# Patient Record
Sex: Female | Born: 1935
Health system: Southern US, Community
[De-identification: ages and names within clinical notes are randomized; demographics above are authoritative.]

## PROBLEM LIST (undated history)

## (undated) DIAGNOSIS — M109 Gout, unspecified: Secondary | ICD-10-CM

## (undated) DIAGNOSIS — N390 Urinary tract infection, site not specified: Secondary | ICD-10-CM

## (undated) DIAGNOSIS — E876 Hypokalemia: Secondary | ICD-10-CM

## (undated) DIAGNOSIS — J189 Pneumonia, unspecified organism: Secondary | ICD-10-CM

## (undated) DIAGNOSIS — E785 Hyperlipidemia, unspecified: Secondary | ICD-10-CM

## (undated) DIAGNOSIS — E0590 Thyrotoxicosis, unspecified without thyrotoxic crisis or storm: Secondary | ICD-10-CM

## (undated) DIAGNOSIS — I10 Essential (primary) hypertension: Secondary | ICD-10-CM

## (undated) DIAGNOSIS — I509 Heart failure, unspecified: Secondary | ICD-10-CM

## (undated) DIAGNOSIS — L03119 Cellulitis of unspecified part of limb: Secondary | ICD-10-CM

## (undated) HISTORY — DX: Hyperlipidemia, unspecified: E78.5

## (undated) HISTORY — PX: TONSILLECTOMY AND ADENOIDECTOMY: SUR1326

## (undated) HISTORY — DX: Pneumonia, unspecified organism: J18.9

## (undated) HISTORY — DX: Gout, unspecified: M10.9

## (undated) HISTORY — DX: Hypokalemia: E87.6

## (undated) HISTORY — DX: Cellulitis of unspecified part of limb: L03.119

## (undated) HISTORY — DX: Heart failure, unspecified: I50.9

## (undated) HISTORY — DX: Urinary tract infection, site not specified: N39.0

## (undated) HISTORY — DX: Essential (primary) hypertension: I10

## (undated) HISTORY — DX: Thyrotoxicosis, unspecified without thyrotoxic crisis or storm: E05.90

---

## 1981-09-21 HISTORY — PX: BREAST LUMPECTOMY: SHX2

## 2011-09-22 HISTORY — PX: CARDIAC CATHETERIZATION: SHX172

## 2012-09-21 HISTORY — PX: PACEMAKER INSERTION: SHX728

## 2012-09-21 HISTORY — PX: VALVE REPLACEMENT: SUR13

## 2015-01-09 ENCOUNTER — Ambulatory Visit (INDEPENDENT_AMBULATORY_CARE_PROVIDER_SITE_OTHER): Payer: Commercial Managed Care - HMO | Admitting: Internal Medicine

## 2015-01-09 ENCOUNTER — Ambulatory Visit (INDEPENDENT_AMBULATORY_CARE_PROVIDER_SITE_OTHER)
Admission: RE | Admit: 2015-01-09 | Discharge: 2015-01-09 | Disposition: A | Discharge status: Home / Self Care | Payer: Commercial Managed Care - HMO | Source: Ambulatory Visit | Attending: Internal Medicine | Admitting: Internal Medicine

## 2015-01-09 ENCOUNTER — Encounter: Payer: Self-pay | Admitting: Internal Medicine

## 2015-01-09 VITALS — BP 130/66 | HR 91 | Ht 62.0 in | Wt 140.0 lb

## 2015-01-09 DIAGNOSIS — J9612 Chronic respiratory failure with hypercapnia: Secondary | ICD-10-CM

## 2015-01-09 DIAGNOSIS — J449 Chronic obstructive pulmonary disease, unspecified: Secondary | ICD-10-CM | POA: Insufficient documentation

## 2015-01-09 DIAGNOSIS — R918 Other nonspecific abnormal finding of lung field: Secondary | ICD-10-CM | POA: Diagnosis not present

## 2015-01-09 MED ORDER — UMECLIDINIUM-VILANTEROL 62.5-25 MCG/INH IN AEPB: 2.0000 | INHALATION_SPRAY | Freq: Once | RESPIRATORY_TRACT | Status: DC

## 2015-01-09 NOTE — Patient Instructions (Signed)
Stop advair   Plan A automatic  Start anoro one click each x 2 drags  Plan B = backup Only use your albuterol as a rescue medication to be used if you can't catch your breath by resting or doing a relaxed purse lip breathing pattern.  - The less you use it, the better it will work when you need it. - Ok to use up to 2 puffs  every 4 hours if you must but call for immediate appointment if use goes up over your usual need - Don't leave home without it !!  (think of it like the spare tire for your car)    Plan C = crisis Nebulizer = duoneb ok to use up every 6 hours   For now stay on 02  Work on inhaler technique:  relax and gently blow all the way out then take a nice smooth deep breath back in, triggering the inhaler at same time you start breathing in.  Hold for up to 5 seconds if you can.  Rinse and gargle with water when done     Please remember to go to the  x-ray department downstairs for your tests - we will call you with the results when they are available.     Please schedule a follow up office visit in 2 weeks, sooner if needed

## 2015-01-09 NOTE — Progress Notes (Signed)
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     Admit date: 12/15/2014 Discharge date   12/19/2014    Admitting Physician: Donald Prose, MD Discharge Physician: Donald Prose, MD   Discharge Diagnoses:  Acute hypoxic respiratory failure due to bacterial CAP and sepsis E coli UTI  Admission Condition: fair Discharged Condition: good  Hospital Course:  For full details, please see H&P, progress notes, consult notes and ancillary notes. Briefly, Victoria Knox is a 80 y.o. female Caucasian with severe AS s/p TAVR, CHF, COPD who presented with fevers, nausea, vomiting found to have PNA and cystitis.  Acute hypoxic respiratory failure due to bacterial CAP: In the ED, the patient was febrile with a CXR showing worsening of right basilar opacities concerning for pneumonia. She was started on rocephin and azithro for empiric coverage of CAP microbes. The patient had an episode of hypoxia and increased lethargy on the floor on 3/28 that improved with narcan, concerning for likely overmedication. She was noted to desaturate with ambulaton, and required 4L via [place] to improve her oxygenation. Prior to discharge, she completed a 5-day course of antibiotics with azithro and rocephin.   E coli UTI: The patient was noted to have nitrites and bacteria on UA with a urine culture that showed e coli resistant only to cipro, bactrim, and augmentin.  She completed a 5-day course of antibiotics prior to discharge as noted above.  Discharge Follow-up Action Items: 1. Follow-up in 1-2 weeks for hospital follow-up 2. Monitor for respiratory improvement and ability to wean home oxygen  Patient's Ordered Code Status: DNR/DNI Limited scope of treatment     01/09/2015 1st [facility] [facility] office visit/ Victoria Knox   Chief Complaint  Patient presents with  . Pulmonary Consult    Referred by Lucita Lora, PA at [facility] in [place]. Pt states dxed with COPD in 2014. She c/o DOE for the past 3 wks- gets out of breath walking approx 75 ft. She had been "walking miles" every  day while at work with no problem.   Last echo ok 10/02/14 with no pfts on file in care everywhere.   No obvious day to day or daytime variabilty or assoc chronic cough or cp or chest tightness, subjective wheeze overt sinus or hb symptoms. No unusual exp hx or h/o childhood pna/ asthma or knowledge of premature birth.  Sleeping ok without nocturnal  or early am exacerbation  of respiratory  c/o's or need for noct saba. Also denies any obvious fluctuation of symptoms with weather or environmental changes or other aggravating or alleviating factors except as outlined above   Current Medications, Allergies, Complete Past Medical History, Past Surgical History, Family History, and Social History were reviewed in [employer] record.  Review of Systems  Constitutional: Negative for fever, chills and unexpected weight change.  HENT: Negative for congestion, dental problem, ear pain, nosebleeds, postnasal drip, rhinorrhea, sinus pressure, sneezing, sore throat, trouble swallowing and voice change.   Eyes: Negative for visual disturbance.  Respiratory: Positive for shortness of breath. Negative for cough and choking.   Cardiovascular: Negative for chest pain and leg swelling.  Gastrointestinal: Negative for vomiting, abdominal pain and  diarrhea.  Genitourinary: Negative for difficulty urinating.  Musculoskeletal: Negative for arthralgias.  Skin: Negative for rash.  Neurological: Negative for tremors, syncope and headaches.  Hematological: Does not bruise/bleed easily.       Objective:   Physical Exam   W/c bound wf talking at an extreme rate of speed s difficulty   Wt Readings from Last 3 Encounters:  01/09/15 140 lb (63.504 kg)    Vital signs reviewed  HEENT: nl dentition, turbinates, and orophanx. Nl external ear canals without cough reflex   NECK :  without JVD/Nodes/TM/ nl carotid upstrokes bilaterally   LUNGS: no acc muscle use, clear to A and P bilaterally without cough on insp or exp maneuvers   CV:  RRR  no s3 or murmur or increase in P2, no edema   ABD:  soft and nontender with nl excursion in the supine position. No bruits or organomegaly, bowel sounds nl  MS:  warm without deformities, calf tenderness, cyanosis or clubbing  SKIN: warm and dry without lesions    NEURO:  alert, approp, no deficits    CXR PA and Lateral:   01/09/2015 :     I personally reviewed images and agree with radiology impression as follows:     Asymmetric pattern of mixed interstitial and airspace disease, predominantly involving the right base. Differential diagnosis includes multifocal pneumonia, asymmetric edema, with less likely etiologies including lymphangitic carcinomatosis, chronic interstitial lung disease    Assessment & Plan:

## 2015-01-10 ENCOUNTER — Telehealth: Payer: Self-pay | Admitting: Internal Medicine

## 2015-01-10 ENCOUNTER — Encounter: Payer: Self-pay | Admitting: Internal Medicine

## 2015-01-10 DIAGNOSIS — R918 Other nonspecific abnormal finding of lung field: Secondary | ICD-10-CM | POA: Insufficient documentation

## 2015-01-10 DIAGNOSIS — J9612 Chronic respiratory failure with hypercapnia: Secondary | ICD-10-CM | POA: Insufficient documentation

## 2015-01-10 NOTE — Telephone Encounter (Signed)
Patient says that the Spiriva caused her to have a severe asthma attack.  Patient wants to make sure that the Anoro will not cause the same problem.  She said that she has taken Advair before and didn't have a problem with that.  She is afraid to try the Anoro and wants reassurance from Dr. Melvyn Novas that the Jearl Klinefelter is nothing like Spiriva and she will not have the same reaction.   MW - please advise.

## 2015-01-10 NOTE — Progress Notes (Signed)
Quick Note:  ATC, line was busy, WCB ______

## 2015-01-10 NOTE — Assessment & Plan Note (Signed)
Acute changes noted 12/15/14 in setting of uti ? Sepsis ? ali > denies exposure to [facility] but this is also in ddx   Will need f/u in 2 weeks with cxr/ bnp/ esr but limited options for w/u with ddx  Miscellaneous:Alv microlithiasis, alv proteinosis, asp, bronchiectais, BOOP   ARDS/ AIP Occupational dz/ HSP Neoplasm Infection Drug (esp macrodantin in pt with recent utis) Pulmonary emboli, Protein disorders Edema/Eosinophilic dz Sarcoidosis Hist X / Hemorrhage Idiopathic /collagen vasc dz related

## 2015-01-10 NOTE — Telephone Encounter (Signed)
Patient notified.  Patient will try the Anoro.  If it doesn't work, she will call and let us know.  We can send in Advair 250/50 if the Anoro does not work. Nothing further needed.

## 2015-01-10 NOTE — Assessment & Plan Note (Addendum)
   DDX of  difficult airways management all start with A and  include Adherence, Ace Inhibitors, Acid Reflux, Active Sinus Disease, Alpha 1 Antitripsin deficiency, Anxiety masquerading as Airways dz,  ABPA,  allergy(esp in young), Aspiration (esp in elderly), Adverse effects of DPI,  Active smokers, plus two Bs  = Bronchiectasis and Beta blocker use..and one C= CHF  Adherence is always the initial "prime suspect" and is a multilayered concern that requires a "trust but verify" approach in every patient - starting with knowing how to use medications, especially inhalers, correctly, keeping up with refills and understanding the fundamental difference between maintenance and prns vs those medications only taken for a very short course and then stopped and not refilled.  - The proper method of use, as well as anticipated side effects, of a metered-dose inhaler are discussed and demonstrated to the patient. Improved effectiveness after extensive coaching during this visit to a level of approximately  75% but 90% with dpi   ? Asp/gerd > rec leave off fish oil for now  ? Chf/ not supported by last echo at [facility] 09/2014   Needs f/u for pfts

## 2015-01-10 NOTE — Telephone Encounter (Signed)
We did the anoro in the office and she did fine and it's not the same medication as spiriva so should do fine.  If she wants to take advair instead that's also fine but the dose is 250/50 one bid and call it in if she doesn't have it

## 2015-01-10 NOTE — Telephone Encounter (Signed)
ATC-Busy signal  wcb

## 2015-01-10 NOTE — Assessment & Plan Note (Signed)
-   HC03  41 11/22/14 but goes back to 2014 in chemistries at [facility]   So this is longstanding and has been relatively well compensated until now albeit with way too much exp to chronic saba and should try a LAMA/LABA combination having failed to tol spiriva in past > anoro started (see copd)  In meantime rec wear 02 24/7

## 2015-01-14 NOTE — Progress Notes (Signed)
Quick Note:  Spoke with pt and notified of results per Dr. Wert. Pt verbalized understanding and denied any questions.  ______ 

## 2015-01-17 ENCOUNTER — Telehealth: Payer: Self-pay | Admitting: Internal Medicine

## 2015-01-17 NOTE — Telephone Encounter (Signed)
Ok to stop but will need ov with me or Tammy to work out alternative in meantime has albuterol hfa and neb

## 2015-01-17 NOTE — Telephone Encounter (Signed)
Called and spoke to pt. Pt c/o hand tremors/feeling very jittery after taking anoro for 6 days. Pt stated she does not want to take it anymore. Pt stated the anoro helped her breathing when she takes it (at 0800) and will last till about 5pm and then her breathing feels worse.   MW please advise.

## 2015-01-17 NOTE — Telephone Encounter (Signed)
Called and spoke to pt. Informed her of the recs per MW. Pt already has a pending appt on 5/4. Pt verbalized understanding and denied any further questions or concerns at this time.

## 2015-01-23 ENCOUNTER — Other Ambulatory Visit (INDEPENDENT_AMBULATORY_CARE_PROVIDER_SITE_OTHER): Payer: Commercial Managed Care - HMO

## 2015-01-23 ENCOUNTER — Ambulatory Visit (INDEPENDENT_AMBULATORY_CARE_PROVIDER_SITE_OTHER): Payer: Commercial Managed Care - HMO | Admitting: Internal Medicine

## 2015-01-23 ENCOUNTER — Encounter: Payer: Self-pay | Admitting: Internal Medicine

## 2015-01-23 ENCOUNTER — Ambulatory Visit (INDEPENDENT_AMBULATORY_CARE_PROVIDER_SITE_OTHER)
Admission: RE | Admit: 2015-01-23 | Discharge: 2015-01-23 | Disposition: A | Discharge status: Home / Self Care | Payer: Commercial Managed Care - HMO | Source: Ambulatory Visit | Attending: Internal Medicine | Admitting: Internal Medicine

## 2015-01-23 VITALS — BP 124/72 | HR 93 | Ht 62.0 in | Wt 139.0 lb

## 2015-01-23 DIAGNOSIS — J9612 Chronic respiratory failure with hypercapnia: Secondary | ICD-10-CM

## 2015-01-23 DIAGNOSIS — R0600 Dyspnea, unspecified: Secondary | ICD-10-CM

## 2015-01-23 DIAGNOSIS — J449 Chronic obstructive pulmonary disease, unspecified: Secondary | ICD-10-CM | POA: Diagnosis not present

## 2015-01-23 DIAGNOSIS — E0590 Thyrotoxicosis, unspecified without thyrotoxic crisis or storm: Secondary | ICD-10-CM | POA: Diagnosis not present

## 2015-01-23 LAB — CBC WITH DIFFERENTIAL/PLATELET
Basophils Absolute: 0 10*3/uL (ref 0.0–0.1)
Basophils Relative: 0.2 % (ref 0.0–3.0)
EOS PCT: 10.5 % — AB (ref 0.0–5.0)
Eosinophils Absolute: 0.7 10*3/uL (ref 0.0–0.7)
HCT: 41 % (ref 36.0–46.0)
Hemoglobin: 13.6 g/dL (ref 12.0–15.0)
Lymphocytes Relative: 28.2 % (ref 12.0–46.0)
Lymphs Abs: 1.9 10*3/uL (ref 0.7–4.0)
MCHC: 33.3 g/dL (ref 30.0–36.0)
MCV: 90.9 fl (ref 78.0–100.0)
MONOS PCT: 8 % (ref 3.0–12.0)
Monocytes Absolute: 0.5 10*3/uL (ref 0.1–1.0)
NEUTROS ABS: 3.5 10*3/uL (ref 1.4–7.7)
Neutrophils Relative %: 53.1 % (ref 43.0–77.0)
Platelets: 202 10*3/uL (ref 150.0–400.0)
RBC: 4.51 Mil/uL (ref 3.87–5.11)
RDW: 17 % — AB (ref 11.5–15.5)
WBC: 6.7 10*3/uL (ref 4.0–10.5)

## 2015-01-23 LAB — BASIC METABOLIC PANEL
BUN: 15 mg/dL (ref 6–23)
CALCIUM: 9.6 mg/dL (ref 8.4–10.5)
CO2: 35 mEq/L — ABNORMAL HIGH (ref 19–32)
Chloride: 99 mEq/L (ref 96–112)
Creatinine, Ser: 0.74 mg/dL (ref 0.40–1.20)
GFR: 80.63 mL/min (ref >60.00)
Glucose, Bld: 110 mg/dL — ABNORMAL HIGH (ref 70–99)
Potassium: 3.5 mEq/L (ref 3.5–5.1)
Sodium: 140 mEq/L (ref 135–145)

## 2015-01-23 LAB — TSH: TSH: 0.06 u[IU]/mL — ABNORMAL LOW (ref 0.35–4.50)

## 2015-01-23 LAB — BRAIN NATRIURETIC PEPTIDE: PRO B NATRI PEPTIDE: 206 pg/mL — AB (ref 0.0–100.0)

## 2015-01-23 LAB — SEDIMENTATION RATE: SED RATE: 35 mm/h — AB (ref 0–22)

## 2015-01-23 NOTE — Progress Notes (Signed)
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    History of Present Illness  Admit date: 12/15/2014 Discharge date   12/19/2014    Discharge Diagnoses:  Acute hypoxic respiratory failure due to bacterial CAP and sepsis E coli UTI  Admission Condition: fair Discharged Condition: good  Hospital Course:  For full details, please see H&P, progress notes, consult notes and ancillary notes. Briefly, Victoria Knox is a 80 y.o. female Caucasian with severe AS s/p TAVR 2014 , CHF, COPD who presented with fevers, nausea, vomiting found to have PNA and cystitis.  Acute hypoxic respiratory failure due to bacterial CAP: In the ED, the patient was febrile with a CXR showing worsening of right basilar opacities concerning for pneumonia. She was started on rocephin and azithro for empiric coverage of CAP microbes. The patient had an episode of hypoxia and increased lethargy on the floor on 3/28 that improved with narcan, concerning for likely overmedication. She was noted to desaturate with ambulaton, and required 4L via [place] to improve her oxygenation. Prior to discharge, she completed a 5-day course of antibiotics with azithro and rocephin.   E coli UTI: The patient was noted to have nitrites and bacteria on UA with a urine culture that showed e coli resistant only to cipro, bactrim, and augmentin. She completed a  5-day course of antibiotics prior to discharge as noted above.     Patient's Ordered Code Status: DNR/DNI Limited scope of treatment     01/09/2015 1st [place] [facility] office visit/ Brittley Regner   Chief Complaint  Patient presents with  . Pulmonary Consult    Referred by Lucita Lora, PA at [facility] in [place]. Pt states dxed with COPD in 2014. She c/o DOE for the past 3 wks- gets out of breath walking approx 75 ft. She had been "walking miles" every  day while at work with no problem.   Last echo ok 10/02/14 with no pfts on file in care everywhere.  rec Stop advair  Plan A automatic  Start anoro one click each x 2 drags> could not tolerate jitteriness  Plan B = backup Only use your albuterol as a rescue medication  Plan C = crisis Nebulizer = duoneb ok to use up every 6 hours  For now stay on 02 Work on inhaler technique:             01/23/2015 f/u ov/[facility] re: new onset resp failure/ hypercarbia with GOLD IV copd  Chief Complaint  Patient presents with  . Follow-up  wanted to change the original hx as follows:  Doe went from  bad to worse starting in Dec 2015 admit with pna at [place]  got better enough to go back to work walking "all over the place s  people passing her" on just on albterol and no 02 then kidney infection> admit > then placed on 02   Breathing not much better on anoro, best resp is to neb saba last used 3 h prior to OV   No cough at all "but I've had bronchitis all my life"    No obvious day to day or daytime variabilty or assoc chronic cough or cp or chest tightness, subjective wheeze overt sinus or hb symptoms. No unusual exp hx or h/o childhood pna/ asthma or knowledge of premature birth.  Sleeping ok without nocturnal  or early am exacerbation  of respiratory  c/o's or need for noct saba. Also denies any obvious fluctuation of symptoms with weather or environmental changes or other aggravating or alleviating factors except as outlined above    Current Medications, Allergies, Complete Past Medical History, Past Surgical History, Family History, and Social History were reviewed in [employer] record.  ROS  The following are not active complaints unless bolded sore throat, dysphagia, dental problems, itching, sneezing,  nasal congestion or excess/ purulent secretions, ear ache,   fever, chills, sweats, unintended wt loss, pleuritic or exertional cp, hemoptysis,  orthopnea pnd or leg swelling, presyncope, palpitations, heartburn, abdominal pain, anorexia, nausea, vomiting, diarrhea  or change in bowel or urinary habits, change in stools or urine, dysuria,hematuria,  rash, arthralgias, visual complaints, headache, numbness weakness or ataxia or problems with walking or coordination,  change in mood/affect or memory.                          Objective:   Physical Exam   W/c bound wf talking at an extreme rate of speed s difficulty   Wt Readings from Last 3 Encounters:  01/23/15 139 lb (63.05 kg)  01/09/15 140 lb (63.504 kg)    Vital signs reviewed   HEENT: nl dentition, turbinates, and orophanx. Nl external ear canals without cough reflex   NECK :  without JVD/Nodes/TM/ nl carotid upstrokes bilaterally   LUNGS: no acc muscle use, clear to A and P bilaterally without cough on insp or exp maneuvers   CV:  RRR  no s3 or murmur or increase in P2, no edema   ABD:  soft and nontender with nl excursion in the supine position. No bruits or organomegaly, bowel sounds nl  MS:  warm without deformities, calf tenderness, cyanosis or clubbing  SKIN: warm and dry without lesions    NEURO:  alert, approp, no deficits     CXR PA and Lateral:   01/23/2015 :     I personally reviewed images and agree with radiology impression as follows:     Stable cardiomegaly, COPD, and bibasilar scarring. No acute Findings   Labs ordered/ reviewed:   Lab 01/23/15 1512  NA 140  K 3.5  CL 99  CO2 35*  BUN 15   CREATININE 0.74  GLUCOSE 110*      Lab 01/23/15 1512  HGB 13.6  HCT 41.0  WBC 6.7  PLT 202.0     Lab Results  Component Value Date   TSH 0.06* 01/23/2015     Lab Results  Component Value Date   PROBNP 206.0* 01/23/2015     Lab Results  Component Value Date   ESRSEDRATE 35* 01/23/2015      .m     Assessment & Plan:

## 2015-01-23 NOTE — Patient Instructions (Addendum)
Ok to leave 02 off at rest and walking around your house but always wear 02 2lpm at bedtime and  walking outside when you walk at a normal pace and if you monitor your 02 the goal is to keep above 90% (so walking slowly off 02 might be a strategy you can use if you don't want to wear 02)  Please remember to go to the lab and x-ray department downstairs for your tests - we will call you with the results when they are available.     GERD (REFLUX)  is an extremely common cause of respiratory symptoms just like yours , many times with no obvious heartburn at all.    It can be treated with medication, but also with lifestyle changes including avoidance of late meals, excessive alcohol, smoking cessation, and avoid fatty foods, chocolate, peppermint, colas, red wine, and acidic juices such as orange juice.  NO MINT OR MENTHOL PRODUCTS SO NO COUGH DROPS  USE SUGARLESS CANDY INSTEAD (Jolley ranchers or Stover's or Life Savers) or even ice chips will also do - the key is to swallow to prevent all throat clearing. NO OIL BASED VITAMINS - use powdered substitutes.- no fish oil - eat more salmon  Please schedule a follow up office visit in 6 weeks, call sooner if needed with pfts on return   Late add : referred to endocrine for low tsh

## 2015-01-24 ENCOUNTER — Telehealth: Payer: Self-pay | Admitting: Internal Medicine

## 2015-01-24 ENCOUNTER — Encounter: Payer: Self-pay | Admitting: Internal Medicine

## 2015-01-24 ENCOUNTER — Other Ambulatory Visit: Payer: Self-pay | Admitting: Internal Medicine

## 2015-01-24 DIAGNOSIS — E0590 Thyrotoxicosis, unspecified without thyrotoxic crisis or storm: Secondary | ICD-10-CM

## 2015-01-24 DIAGNOSIS — J449 Chronic obstructive pulmonary disease, unspecified: Secondary | ICD-10-CM

## 2015-01-24 NOTE — Telephone Encounter (Signed)
MW are you okay with this order? thanks

## 2015-01-24 NOTE — Assessment & Plan Note (Signed)
-   echo 10/02/14 left ventricular size is normal. Left ventricular systolic function is normal. LV ejection fraction = 55%. Abnormal (paradoxical) septal motion consistent with RV pacemaker. Left ventricular filling pattern is impaired. The right ventricle is normal in size and function. The left atrium is mildly dilated. 3 year follow up for 28mm Core Valve No aortic regurgitation is present. There is mild mitral regurgitation.   Lab Results  Component Value Date   PROBNP 206.0* 01/23/2015     Doubt sign chf at present > no change needed

## 2015-01-24 NOTE — Assessment & Plan Note (Signed)
TSH 01/24/2015  = 0.06 assoc with rapid speech   Will need endocrinology eval > referred

## 2015-01-24 NOTE — Assessment & Plan Note (Signed)
-   HC03  39 11/22/14 but goes back to 2014 in chemistries at [facility]  - 01/23/2015   Walked RA x one lap @ 185 stopped due to desat  To 82% without sob, then did one lap on 2lpm s desat but stopped by hip pain    rx as of 01/23/15 = 02 2lpm sleeping and walking outside the house but otherwise no need for 02

## 2015-01-24 NOTE — Assessment & Plan Note (Signed)
-   quit smoking 2006  - 01/09/2015 p extensive coaching HFA effectiveness =    25%  - trial of anoro one puff each am 01/09/2015 > could not tol due to jittery - 01/23/2015 spirometry FEV1  0.48 ( 27%) ratio 41   I had an extended discussion with the patient reviewing all relevant studies completed to date and  lasting 15 to 20 minutes of a 25 minute visit on the following ongoing concerns:   She is probably much worse than previously apparent by her hx as I strongly doubt she was "walking all over the place" after her admit with pna in dec 2015 and ? Whether also had  Sepsis/ ali leading to 02 dep since her admit to [facility] but for now little to offer except for neb as can't tol lama or laba and doesn't appear to have enough AB to warrant ICS  02 issues adderessed separately   Will ask her to return for full pfts and regroup then

## 2015-01-24 NOTE — Progress Notes (Signed)
Quick Note:  Spoke with pt and notified of results per Dr. Wert. Pt verbalized understanding and denied any questions.  ______ 

## 2015-01-24 NOTE — Telephone Encounter (Signed)
Fine with me

## 2015-01-24 NOTE — Telephone Encounter (Signed)
ATC x 3. Line busy. WCB.  

## 2015-01-25 NOTE — Telephone Encounter (Signed)
Called and spoke to pt. Informed pt of the recs per MW. Pulmonary rehab referral order placed. Pt verbalized understanding and denied any further questions or concerns at this time.

## 2015-01-30 ENCOUNTER — Other Ambulatory Visit: Payer: Self-pay | Admitting: Internal Medicine

## 2015-01-30 NOTE — Telephone Encounter (Signed)
Called [facility] and spoke with Collie Siad at [phone] asking about an appt for pt. Collie Siad states she hadn't received fax and asked that we refax it. Suanne Marker is refaxing papers per request. Tried to call patient to inform that paperwork was signed today by Dr.Wert and that it has been faxed. Also, to let pt know that once it is received that she would be contacted by the [facility].

## 2015-01-31 NOTE — Telephone Encounter (Signed)
Patient notified.  Patient given number for [facility] to follow up with their office. Nothing further needed.

## 2015-02-08 ENCOUNTER — Encounter: Payer: Self-pay | Admitting: Endocrinology

## 2015-02-08 ENCOUNTER — Ambulatory Visit (INDEPENDENT_AMBULATORY_CARE_PROVIDER_SITE_OTHER): Payer: Commercial Managed Care - HMO | Admitting: Endocrinology

## 2015-02-08 VITALS — BP 126/64 | HR 96 | Temp 98.1°F | Wt 142.0 lb

## 2015-02-08 DIAGNOSIS — E0590 Thyrotoxicosis, unspecified without thyrotoxic crisis or storm: Secondary | ICD-10-CM

## 2015-02-08 MED ORDER — METHIMAZOLE 10 MG PO TABS: 10.0000 mg | ORAL_TABLET | Freq: Two times a day (BID) | ORAL | Status: DC

## 2015-02-08 NOTE — Patient Instructions (Signed)
i have sent a prescription to your pharmacy, for the thyroid if ever you have fever while taking methimazole, stop it and call us, because of the risk of a rare side-effect Please come back for a follow-up appointment in 2-3 weeks We can reconsider the radioactive iodine treatment in the future if you want to.   Hyperthyroidism The thyroid is a large gland located in the lower front part of your neck. The thyroid helps control metabolism. Metabolism is how your body uses food. It controls metabolism with the hormone thyroxine. When the thyroid is overactive, it produces too much hormone. When this happens, these following problems may occur:   Nervousness  Heat intolerance  Weight loss (in spite of increase food intake)  Diarrhea  Change in hair or skin texture  Palpitations (heart skipping or having extra beats)  Tachycardia (rapid heart rate)  Loss of menstruation (amenorrhea)  Shaking of the hands CAUSES  Grave's Disease (the immune system attacks the thyroid gland). This is the most common cause.  Inflammation of the thyroid gland.  Tumor (usually benign) in the thyroid gland or elsewhere.  Excessive use of thyroid medications (both prescription and 'natural').  Excessive ingestion of Iodine. DIAGNOSIS  To prove hyperthyroidism, your caregiver may do blood tests and ultrasound tests. Sometimes the signs are hidden. It may be necessary for your caregiver to watch this illness with blood tests, either before or after diagnosis and treatment. TREATMENT Short-term treatment There are several treatments to control symptoms. Drugs called beta blockers may give some relief. Drugs that decrease hormone production will provide temporary relief in many people. These measures will usually not give permanent relief. Definitive therapy There are treatments available which can be discussed between you and your caregiver which will permanently treat the problem. These treatments range  from surgery (removal of the thyroid), to the use of radioactive iodine (destroys the thyroid by radiation), to the use of antithyroid drugs (interfere with hormone synthesis). The first two treatments are permanent and usually successful. They most often require hormone replacement therapy for life. This is because it is impossible to remove or destroy the exact amount of thyroid required to make a person euthyroid (normal). HOME CARE INSTRUCTIONS  See your caregiver if the problems you are being treated for get worse. Examples of this would be the problems listed above. SEEK MEDICAL CARE IF: Your general condition worsens. MAKE SURE YOU:   Understand these instructions.  Will watch your condition.  Will get help right away if you are not doing well or get worse. Document Released: 09/07/2005 Document Revised: 11/30/2011 Document Reviewed: 01/19/2007 [facility] Patient Information 2015 [place], [place]. This information is not intended to replace advice given to you by your health care provider. Make sure you discuss any questions you have with your health care provider.

## 2015-02-08 NOTE — Progress Notes (Signed)
Subjective:    Patient ID: Victoria Knox, female    DOB: 11/23/1934, 80 y.o.   MRN: [id number]  HPI Pt says she was dx'ed with hyperthyroidism in 2008.  she has never been on therapy for this.  He has never had XRT to the anterior neck, or thyroid surgery, but she has had [place] and Bx several times over these years.  she does not consume kelp or any other prescribed or non-prescribed thyroid medication.  He has never been on amiodarone.   She reports slight dyspnea sensation in the chest, and assoc fatigue.   Past Medical History  Diagnosis Date  . Cellulitis of lower limb   . CHF (congestive heart failure)   . Pneumonia   . Gout   . UTI (lower urinary tract infection)   . Hyperlipidemia   . Benign essential HTN   . Hypopotassemia   . Hyperthyroidism     Past Surgical History  Procedure Laterality Date  . Breast lumpectomy Left 1983  . Tonsillectomy and adenoidectomy    . Cardiac catheterization  2013  . Pacemaker insertion  Jan 2014  . Valve replacement  Jan 2014    History   Social History  . Marital Status: Widowed    Spouse Name: N/A  . Number of Children: N/A  . Years of Education: N/A   Occupational History  . [employer]    Social History Main Topics  . Smoking status: Former Smoker -- 1.00 packs/day for 40 years    Types: Cigarettes    Quit date: 09/21/2004  . Smokeless tobacco: Not on file  . Alcohol Use: Not on file  . Drug Use: Not on file  . Sexual Activity: Not on file   Other Topics Concern  . Not on file   Social History Narrative    Current Outpatient Prescriptions on File Prior to Visit  Medication Sig Dispense Refill  . albuterol (PROVENTIL HFA;VENTOLIN HFA) 108 (90 BASE) MCG/ACT inhaler Inhale 2 puffs into the lungs 4 (four) times daily as needed for wheezing or shortness of breath.    . allopurinol (ZYLOPRIM) 300 MG tablet Take 300 mg by mouth daily.    . B Complex-C (SUPER B COMPLEX PO) Take 2 tablets by mouth daily.    .  furosemide (LASIX) 40 MG tablet Take 40 mg by mouth daily.    Marland Kitchen ipratropium-albuterol (DUONEB) 0.5-2.5 (3) MG/3ML SOLN Take 3 mLs by nebulization every 6 (six) hours as needed.    . meloxicam (MOBIC) 15 MG tablet Take 15 mg by mouth daily.    . potassium chloride (MICRO-K) 10 MEQ CR capsule Take 10 mEq by mouth daily.     No current facility-administered medications on file prior to visit.    Allergies  Allergen Reactions  . Spiriva Handihaler [Tiotropium Bromide Monohydrate] Shortness Of Breath  . Asa [Aspirin] Nausea Only  . Ativan [Lorazepam]     "coma"  . Codeine Sulfate Nausea Only  . Other     "ALL ANTIHISTAMINES"- cause hives and "bronchial spasms"  . Vancomycin Itching  . Latex Rash    Family History  Problem Relation Age of Onset  . CAD Father   . Stroke Father   . Heart attack Mother 63  . Colon cancer Brother   . Breast cancer Sister   . Breast cancer Daughter   . Thyroid disease Sister     BP 126/64 mmHg  Pulse 96  Temp(Src) 98.1 F (36.7 C) (Oral)  Wt 142  lb (64.411 kg)  SpO2 91%  Review of Systems denies headache, hoarseness, double vision, palpitations, diarrhea, polyuria, excessive diaphoresis, numbness, anxiety, and rhinorrhea.  She has gained weight.  She has myalgias, heat intolerance, tremor, easy bruising, and chronic posterior neck pain.     Objective:   Physical Exam VS: see vs page GEN: no distress HEAD: head: no deformity eyes: no periorbital swelling, no proptosis external nose and ears are normal mouth: no lesion seen NECK: multinodular goiter, moderate in size, (R>L). CHEST WALL: no deformity LUNGS: clear to auscultation CV: reg rate and rhythm; systolic murmur is heard; bilat ankle varicosities. ABD: abdomen is soft, nontender.  no hepatosplenomegaly.  not distended.  no hernia GENITALIA:  Normal female.   MUSCULOSKELETAL: muscle bulk and strength are grossly normal.  no obvious joint swelling.  gait is normal and steady.    EXTEMITIES: no deformity. no edema.   PULSES: dorsalis pedis intact bilat.  NEURO:  cn 2-12 grossly intact.   readily moves all 4's.  sensation is intact to touch on the feet. SKIN:  Normal texture and temperature.  No rash or suspicious lesion is visible.   NODES:  None palpable at the neck.  PSYCH: alert, well-oriented.  Does not appear anxious nor depressed.   Lab Results  Component Value Date   TSH 0.06* 01/23/2015  radiol: chest CT (2013) made no mention of the heart.      Assessment & Plan:  multinodular goiter: new to me, usually hereditary.  Pt does release of information for outside records today. Hyperthyroidism: due to the goiter.   We discussed rx options: pt chooses tapazole, at least for now.     Patient is advised the following: Patient Instructions  i have sent a prescription to your pharmacy, for the thyroid if ever you have fever while taking methimazole, stop it and call us, because of the risk of a rare side-effect Please come back for a follow-up appointment in 2-3 weeks We can reconsider the radioactive iodine treatment in the future if you want to.   Hyperthyroidism The thyroid is a large gland located in the lower front part of your neck. The thyroid helps control metabolism. Metabolism is how your body uses food. It controls metabolism with the hormone thyroxine. When the thyroid is overactive, it produces too much hormone. When this happens, these following problems may occur:   Nervousness  Heat intolerance  Weight loss (in spite of increase food intake)  Diarrhea  Change in hair or skin texture  Palpitations (heart skipping or having extra beats)  Tachycardia (rapid heart rate)  Loss of menstruation (amenorrhea)  Shaking of the hands CAUSES  Grave's Disease (the immune system attacks the thyroid gland). This is the most common cause.  Inflammation of the thyroid gland.  Tumor (usually benign) in the thyroid gland or  elsewhere.  Excessive use of thyroid medications (both prescription and 'natural').  Excessive ingestion of Iodine. DIAGNOSIS  To prove hyperthyroidism, your caregiver may do blood tests and ultrasound tests. Sometimes the signs are hidden. It may be necessary for your caregiver to watch this illness with blood tests, either before or after diagnosis and treatment. TREATMENT Short-term treatment There are several treatments to control symptoms. Drugs called beta blockers may give some relief. Drugs that decrease hormone production will provide temporary relief in many people. These measures will usually not give permanent relief. Definitive therapy There are treatments available which can be discussed between you and your caregiver which will permanently treat  the problem. These treatments range from surgery (removal of the thyroid), to the use of radioactive iodine (destroys the thyroid by radiation), to the use of antithyroid drugs (interfere with hormone synthesis). The first two treatments are permanent and usually successful. They most often require hormone replacement therapy for life. This is because it is impossible to remove or destroy the exact amount of thyroid required to make a person euthyroid (normal). HOME CARE INSTRUCTIONS  See your caregiver if the problems you are being treated for get worse. Examples of this would be the problems listed above. SEEK MEDICAL CARE IF: Your general condition worsens. MAKE SURE YOU:   Understand these instructions.  Will watch your condition.  Will get help right away if you are not doing well or get worse. Document Released: 09/07/2005 Document Revised: 11/30/2011 Document Reviewed: 01/19/2007 [facility] Patient Information 2015 [place], [place]. This information is not intended to replace advice given to you by your health care provider. Make sure you discuss any questions you have with your health care provider.

## 2015-02-12 ENCOUNTER — Telehealth: Payer: Self-pay | Admitting: Internal Medicine

## 2015-02-12 NOTE — Telephone Encounter (Signed)
lmtcb X1 for Victoria Knox. It looks like pt had only a simple spirometry on 5/4 and will have her full PFT on 6/15, so there is no post value to give.

## 2015-02-12 NOTE — Telephone Encounter (Signed)
Gave info to [employer] per [employer]. Nothing further needed.

## 2015-02-25 ENCOUNTER — Ambulatory Visit: Payer: Commercial Managed Care - HMO | Admitting: Endocrinology

## 2015-03-06 ENCOUNTER — Encounter: Payer: Self-pay | Admitting: Internal Medicine

## 2015-03-06 ENCOUNTER — Ambulatory Visit (INDEPENDENT_AMBULATORY_CARE_PROVIDER_SITE_OTHER): Payer: Commercial Managed Care - HMO | Admitting: Internal Medicine

## 2015-03-06 ENCOUNTER — Encounter (INDEPENDENT_AMBULATORY_CARE_PROVIDER_SITE_OTHER): Payer: Commercial Managed Care - HMO

## 2015-03-06 VITALS — BP 132/60 | HR 91 | Ht 62.0 in | Wt 139.0 lb

## 2015-03-06 DIAGNOSIS — R0600 Dyspnea, unspecified: Secondary | ICD-10-CM

## 2015-03-06 DIAGNOSIS — J9612 Chronic respiratory failure with hypercapnia: Secondary | ICD-10-CM

## 2015-03-06 DIAGNOSIS — J449 Chronic obstructive pulmonary disease, unspecified: Secondary | ICD-10-CM

## 2015-03-06 NOTE — Progress Notes (Signed)
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    History of Present Illness  Admit date: 12/15/2014 Discharge date   12/19/2014    Discharge Diagnoses:  Acute hypoxic respiratory failure due to bacterial CAP and sepsis E coli UTI  Admission Condition: fair Discharged Condition: good  Hospital Course:  For full details, please see H&P, progress notes, consult notes and ancillary notes. Briefly, Victoria Knox is a 80 y.o. female Caucasian with severe AS s/p TAVR 2014 , CHF, COPD who presented with fevers, nausea, vomiting found to have PNA and cystitis.  Acute hypoxic respiratory failure due to bacterial CAP: In the ED, the patient was febrile with a CXR showing worsening of right basilar opacities concerning for pneumonia. She was started on rocephin and azithro for empiric coverage of CAP microbes. The patient had an episode of hypoxia and increased lethargy on the floor on 3/28 that improved with narcan, concerning for likely overmedication. She was noted to desaturate with ambulaton, and required 4L via [place] to improve her oxygenation. Prior to discharge, she completed a 5-day course of antibiotics with azithro and rocephin.   E coli UTI: The patient was noted to have nitrites and bacteria on UA with a urine culture that showed e coli resistant only to cipro, bactrim, and augmentin. She completed a  5-day course of antibiotics prior to discharge as noted above.     Patient's Ordered Code Status: DNR/DNI Limited scope of treatment     01/09/2015 1st [place] [facility] office visit/ Valerio Pinard   Chief Complaint  Patient presents with  . Pulmonary Consult    Referred by Lucita Lora, PA at [facility] in [place]. Pt states dxed with COPD in 2014. She c/o DOE for the past 3 wks- gets out of breath walking approx 75 ft. She had been "walking miles" every  day while at work with no problem.   Last echo ok 10/02/14 with no pfts on file in care everywhere.  rec Stop advair  Plan A automatic  Start anoro one click each x 2 drags> could not tolerate jitteriness  Plan B = backup Only use your albuterol as a rescue medication  Plan C = crisis Nebulizer = duoneb ok to use up every 6 hours  For now stay on 02 Work on inhaler technique:             01/23/2015 f/u ov/Victoria Knox re: new onset resp failure/ hypercarbia with GOLD IV copd  Chief Complaint  Patient presents with  . Follow-up  wanted to change the original hx as follows:  Doe went from  bad to worse starting in Dec 2015 admit with pna at [place]  got better enough to go back to work walking "all over the place s  people passing her" on just on albterol and no 02 then kidney infection> admit > then placed on 02   Breathing not much better on anoro, best resp is to neb saba last used 3 h prior to OV   No cough at all "but I've had bronchitis all my life"  rec Ok to leave 02 off at rest and walking around your house but always wear 02 2lpm at bedtime and  walking outside when you walk at a normal pace and if you monitor your 02 the goal is to keep above 90% (so walking slowly off 02 might be a strategy you can use if you don't want to wear 02) GERD  Diet  Late add : referred to endocrine for low tsh    03/06/2015 f/u ov/Victoria Knox re: GOLD IV copd in hyperthyroid pt / could not do full pfts " you're not putting me in any box"  Chief  Complaint  Patient presents with  . Follow-up    Breathing is much improved. She states using albuterol inhaler 2 x per day and neb 1 x per day on average.   did not tol anoro/ prefers duoneb / really Not limited by breathing from desired activities  / more by fatigue     No obvious day to day or daytime variabilty or assoc chronic cough or cp or chest tightness, subjective wheeze overt sinus or hb symptoms. No unusual exp hx or h/o childhood pna/ asthma or knowledge of premature birth.  Sleeping ok without nocturnal  or early am exacerbation  of respiratory  c/o's or need for noct saba. Also denies any obvious fluctuation of symptoms with weather or environmental changes or other aggravating or alleviating factors except as outlined above   Current Medications, Allergies, Complete Past Medical History, Past Surgical History, Family History, and Social History were reviewed in [employer] record.  ROS  The following are not active complaints unless bolded sore throat, dysphagia, dental problems, itching, sneezing,  nasal congestion or excess/ purulent secretions, ear ache,   fever, chills, sweats, unintended wt loss, pleuritic or exertional cp, hemoptysis,  orthopnea pnd or leg swelling, presyncope, palpitations, heartburn, abdominal pain, anorexia, nausea, vomiting, diarrhea  or change in bowel or urinary habits, change in stools or urine, dysuria,hematuria,  rash, arthralgias, visual complaints, headache, numbness weakness or ataxia or problems with walking or coordination,  change in mood/affect or memory.             Objective:   Physical Exam   W/c bound wf talking at an extreme rate of speed s difficulty  breathing/ coughing noted   .03/06/2015      139  Wt Readings from Last 3 Encounters:  01/23/15 139 lb (63.05 kg)  01/09/15 140 lb (63.504 kg)    Vital signs reviewed   HEENT: nl dentition, turbinates, and orophanx. Nl external ear canals without cough  reflex   NECK :  without JVD/Nodes/TM/ nl carotid upstrokes bilaterally   LUNGS: no acc muscle use, clear to A and P bilaterally without cough on insp or exp maneuvers   CV:  RRR  no s3 or murmur or increase in P2, no edema   ABD:  soft and nontender with nl excursion in the supine position. No bruits or organomegaly, bowel sounds nl  MS:  warm without deformities, calf tenderness, cyanosis or clubbing  SKIN: warm and dry without lesions    NEURO:  alert, approp, no deficits     CXR PA  and Lateral:   01/23/2015 :     I personally reviewed images and agree with radiology impression as follows:     Stable cardiomegaly, COPD, and bibasilar scarring. No acute Findings   Labs  reviewed:   Lab 01/23/15 1512  NA 140  K 3.5  CL 99  CO2 35*  BUN 15  CREATININE 0.74  GLUCOSE 110*      Lab 01/23/15 1512  HGB 13.6  HCT 41.0  WBC 6.7  PLT 202.0     Lab Results  Component Value Date   TSH 0.06* 01/23/2015     Lab Results  Component Value Date   PROBNP 206.0* 01/23/2015     Lab Results  Component Value Date   ESRSEDRATE 35* 01/23/2015       Assessment & Plan:   Outpatient Encounter Prescriptions as of 03/06/2015  Medication Sig  . albuterol (PROVENTIL HFA;VENTOLIN HFA) 108 (90 BASE) MCG/ACT inhaler Inhale 2 puffs into the lungs 4 (four) times daily as needed for wheezing or shortness of breath.  . allopurinol (ZYLOPRIM) 300 MG tablet Take 300 mg by mouth daily.  . B Complex-C (SUPER B COMPLEX PO) Take 2 tablets by mouth daily.  . furosemide (LASIX) 40 MG tablet Take 40 mg by mouth daily.  Marland Kitchen ipratropium-albuterol (DUONEB) 0.5-2.5 (3) MG/3ML SOLN Take 3 mLs by nebulization every 6 (six) hours as needed.  . methocarbamol (ROBAXIN) 500 MG tablet Take 500 mg by mouth 4 (four) times daily.  . methotrexate (RHEUMATREX) 2.5 MG tablet Take 2.5 mg by mouth daily. Caution:Chemotherapy. Protect from light.  . phenyltoloxamine-acetaminophen 30-325 MG per tablet Take 1  tablet by mouth every 4 (four) hours as needed for pain.  . potassium chloride (MICRO-K) 10 MEQ CR capsule Take 10 mEq by mouth daily.  . [DISCONTINUED] meloxicam (MOBIC) 15 MG tablet Take 15 mg by mouth daily.  . [DISCONTINUED] methimazole (TAPAZOLE) 10 MG tablet Take 1 tablet (10 mg total) by mouth 2 (two) times daily. (Patient not taking: Reported on 03/06/2015)   No facility-administered encounter medications on file as of 03/06/2015.

## 2015-03-06 NOTE — Patient Instructions (Addendum)
No change in recommendations    If you are satisfied with your treatment plan,  let your doctor know and he/she can either refill your medications or you can return here when your prescription runs out.     If in any way you are not 100% satisfied,  please tell us.  If 100% better, tell your friends!  Pulmonary follow up is as needed for breathing or coughing If you don't obviously have a fluid retention issue

## 2015-03-10 ENCOUNTER — Encounter: Payer: Self-pay | Admitting: Internal Medicine

## 2015-03-10 NOTE — Assessment & Plan Note (Signed)
-   HC03  41 11/22/14 but goes back to 2014 in chemistries at [facility]  - 01/23/2015   Walked RA x one lap @ 185 stopped due to desat  To 82% without sob, then did one lap on 2lpm s desat but stopped by hip pain    rx as of 03/06/15 = 02 2lpm sleeping and walking outside the house but otherwise no need for 02

## 2015-03-10 NOTE — Assessment & Plan Note (Signed)
-   quit smoking 2006  - 01/09/2015 p extensive coaching HFA effectiveness =    25%  - trial of anoro one puff each am 01/09/2015 > could not tol due to jittery - 01/23/2015 spirometry FEV1  0.48 ( 27%) ratio 41  - Spirometry 03/06/15  FEV1  0.53 (29%) ratio 33   At this point despite what appears to be GOLD IV and very severe copd she is more limited by fatigue and clearly continues to have unaddressed hyperthroidsm and contemplating I 131 which I endorsed  Since can't handle lama/laba would just uses saba/sama prn for now and return p thyroid problem correct  I had an extended discussion with the patient reviewing all relevant studies completed to date and  lasting 15 to 20 minutes of a 25 minute visit on the following ongoing concerns:  Each maintenance medication was reviewed in detail including most importantly the difference between maintenance and as needed and under what circumstances the prns are to be used.  Please see instructions for details which were reviewed in writing and the patient given a copy.

## 2015-03-20 ENCOUNTER — Ambulatory Visit: Payer: Commercial Managed Care - HMO | Admitting: Endocrinology

## 2015-11-10 IMAGING — CR DG CHEST 2V
2 series · 2 of 2 positions shown · non-contrast
Comparison: 01/09/2015

CLINICAL DATA: Worsening dyspnea for 3 weeks. COPD. Previous aortic
valve replacement and breast carcinoma.

EXAM:
CHEST  2 VIEW

[view not recorded (1 of 2)]
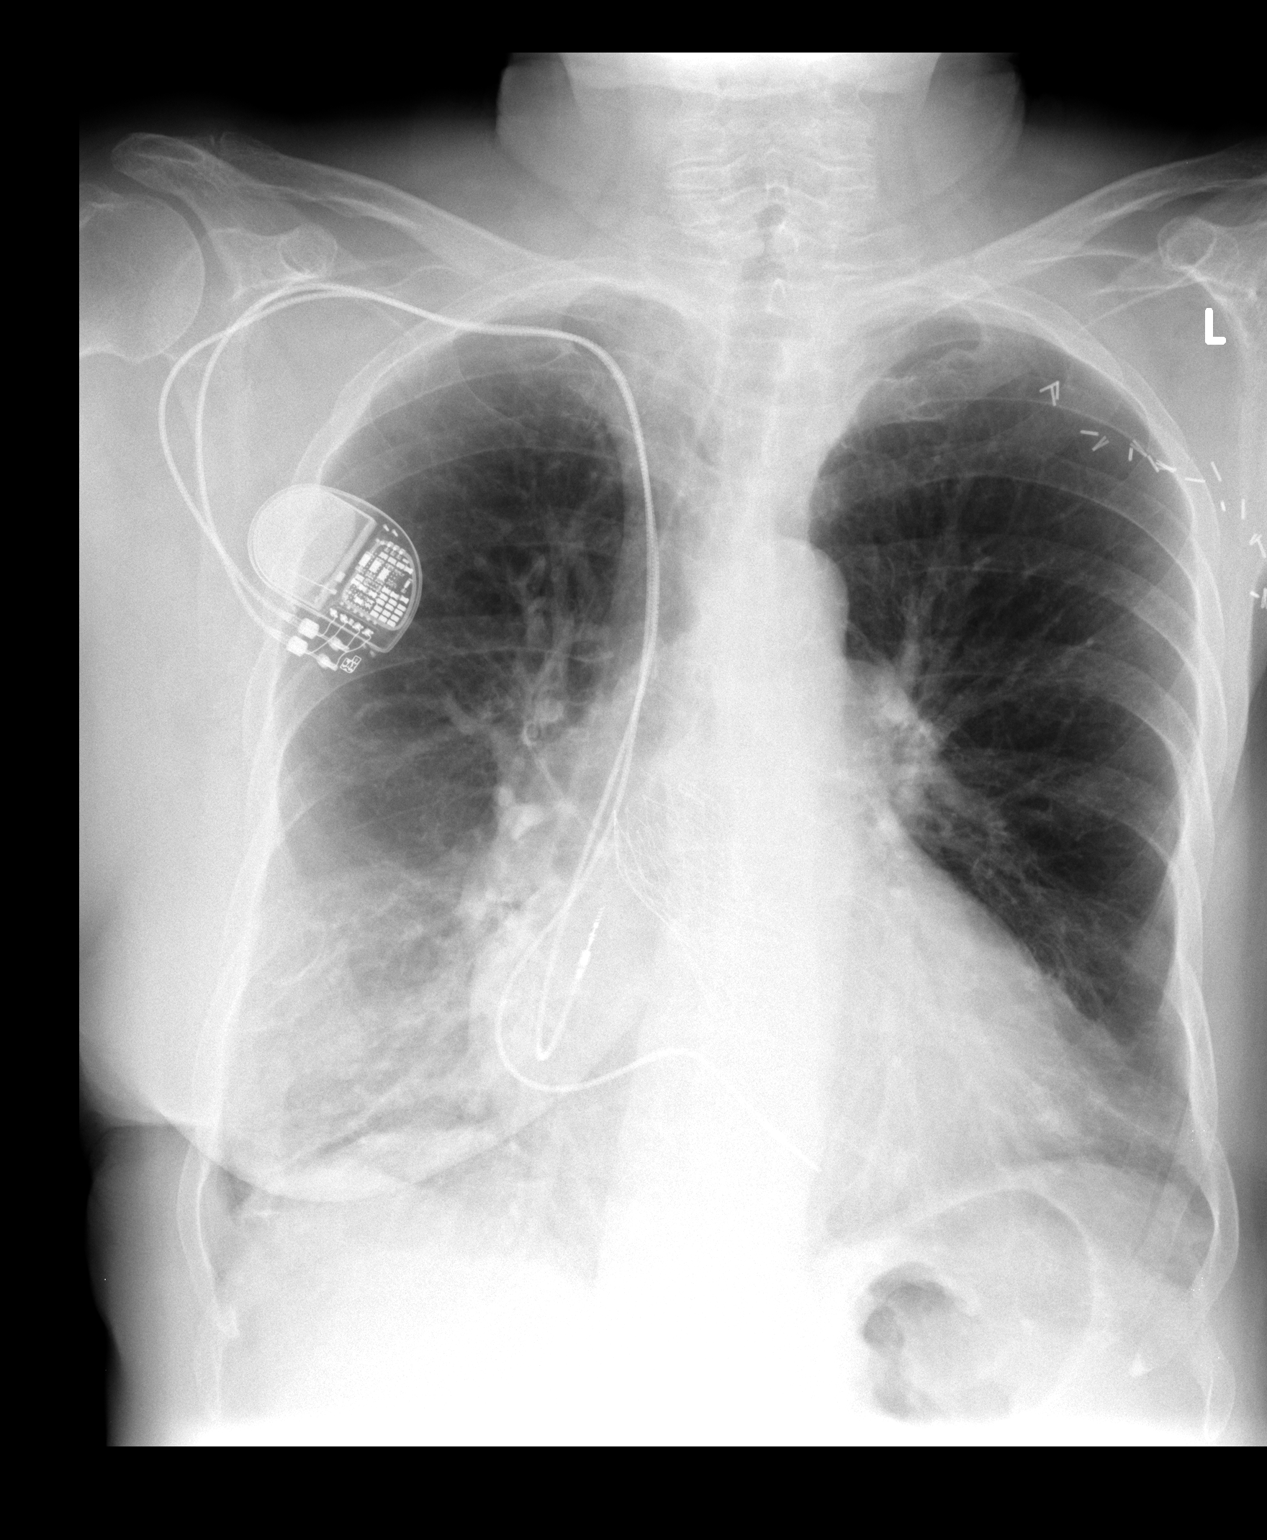

[view not recorded (2 of 2)]
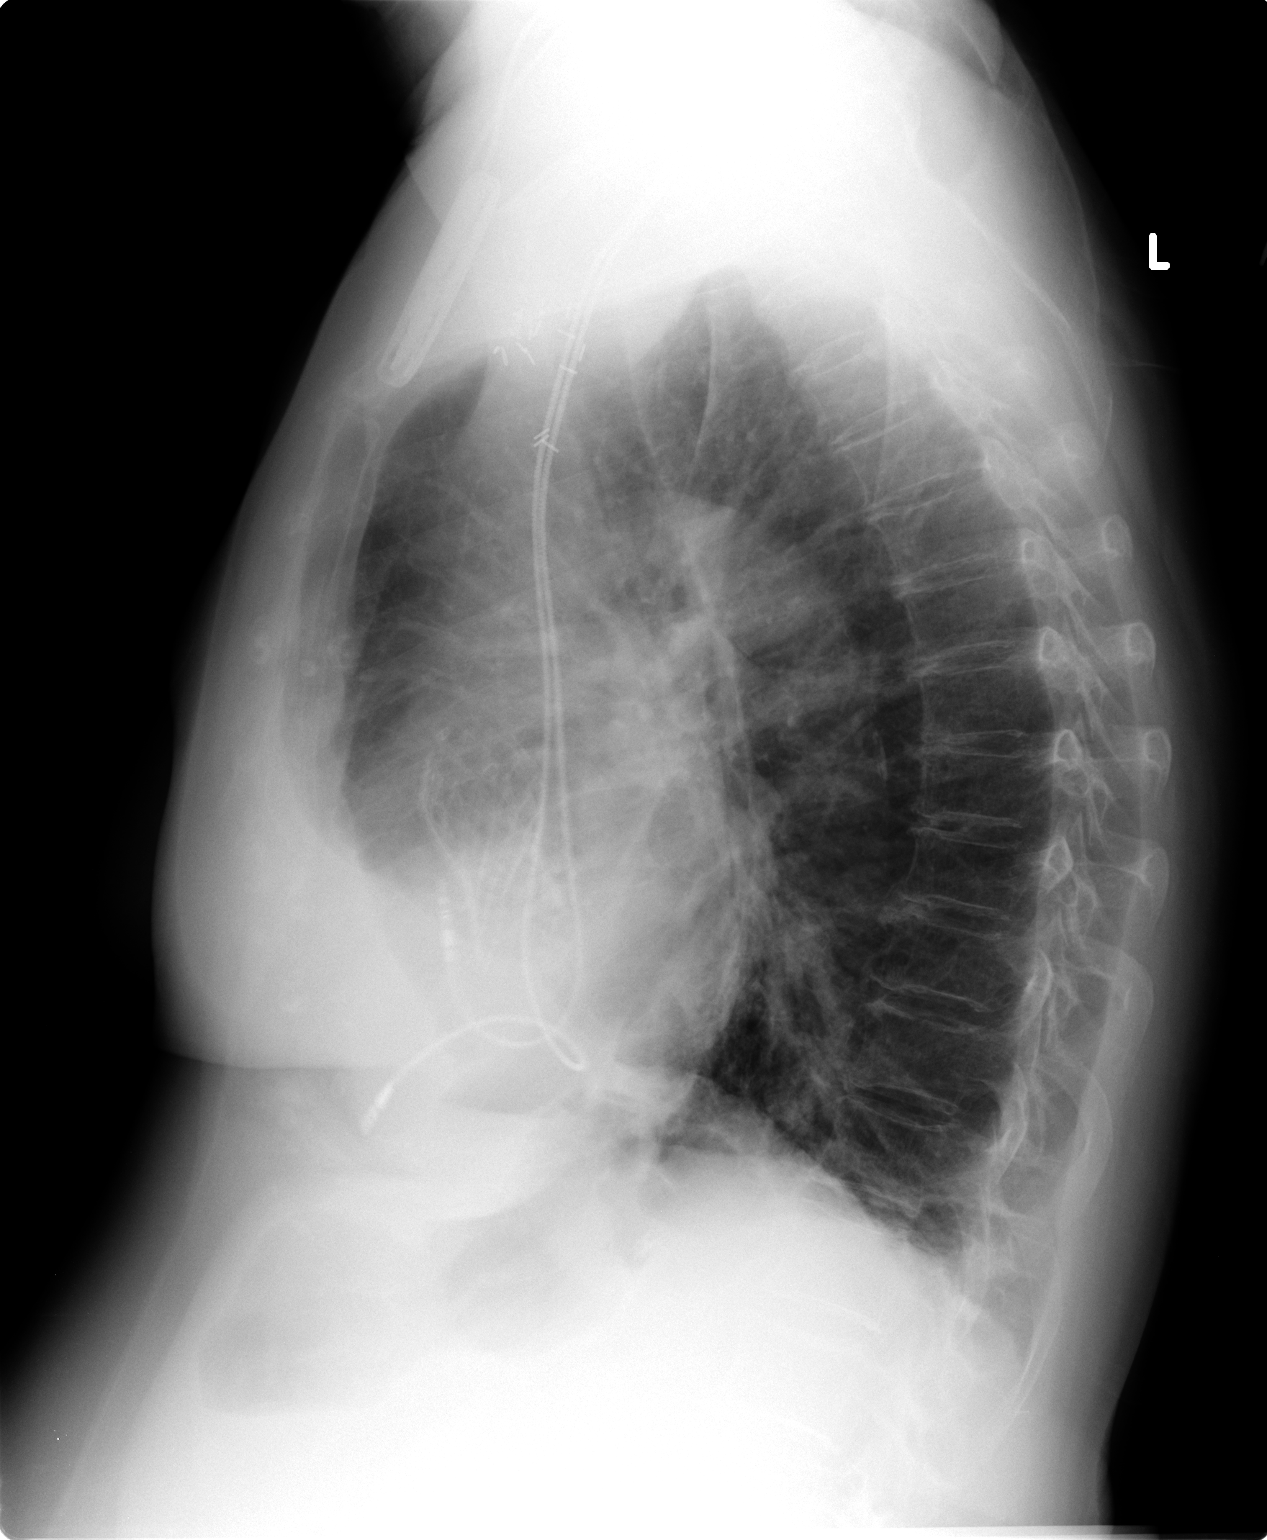

[2 of 2 positions shown; findings below may reference images not displayed]

FINDINGS: Moderate cardiomegaly remains stable. Transcatheter aortic valve
replacement is again seen as well as dual lead transvenous
pacemaker. Patient has undergone previous left mastectomy and
axillary lymph node dissection.

Central pulmonary vascular congestion appears stable, as well as
bibasilar scarring. Pulmonary hyperinflation again seen, suspicious
for COPD. No evidence of acute pulmonary infiltrate or edema. No
evidence of pleural effusion. No definite mass or lymphadenopathy
identified.
IMPRESSION: Stable cardiomegaly, COPD, and bibasilar scarring. No acute
findings.

## 2016-09-22 DIAGNOSIS — I517 Cardiomegaly: Secondary | ICD-10-CM | POA: Diagnosis not present

## 2016-09-22 DIAGNOSIS — I493 Ventricular premature depolarization: Secondary | ICD-10-CM | POA: Diagnosis not present

## 2016-09-22 DIAGNOSIS — I359 Nonrheumatic aortic valve disorder, unspecified: Secondary | ICD-10-CM | POA: Diagnosis not present

## 2016-09-22 DIAGNOSIS — I081 Rheumatic disorders of both mitral and tricuspid valves: Secondary | ICD-10-CM | POA: Diagnosis not present

## 2016-09-22 DIAGNOSIS — Z950 Presence of cardiac pacemaker: Secondary | ICD-10-CM | POA: Diagnosis not present

## 2016-09-22 DIAGNOSIS — I499 Cardiac arrhythmia, unspecified: Secondary | ICD-10-CM | POA: Diagnosis not present

## 2016-09-29 DIAGNOSIS — J449 Chronic obstructive pulmonary disease, unspecified: Secondary | ICD-10-CM | POA: Diagnosis not present

## 2016-09-29 DIAGNOSIS — Z9981 Dependence on supplemental oxygen: Secondary | ICD-10-CM | POA: Diagnosis not present

## 2016-09-29 DIAGNOSIS — Z6828 Body mass index (BMI) 28.0-28.9, adult: Secondary | ICD-10-CM | POA: Diagnosis not present

## 2016-10-01 DIAGNOSIS — R69 Illness, unspecified: Secondary | ICD-10-CM | POA: Diagnosis not present

## 2016-10-06 DIAGNOSIS — R69 Illness, unspecified: Secondary | ICD-10-CM | POA: Diagnosis not present

## 2016-10-06 DIAGNOSIS — J449 Chronic obstructive pulmonary disease, unspecified: Secondary | ICD-10-CM | POA: Diagnosis not present

## 2016-10-17 DIAGNOSIS — J218 Acute bronchiolitis due to other specified organisms: Secondary | ICD-10-CM | POA: Diagnosis not present

## 2016-10-17 DIAGNOSIS — J0100 Acute maxillary sinusitis, unspecified: Secondary | ICD-10-CM | POA: Diagnosis not present

## 2016-10-17 DIAGNOSIS — J4521 Mild intermittent asthma with (acute) exacerbation: Secondary | ICD-10-CM | POA: Diagnosis not present

## 2016-11-06 DIAGNOSIS — J449 Chronic obstructive pulmonary disease, unspecified: Secondary | ICD-10-CM | POA: Diagnosis not present

## 2016-11-19 DIAGNOSIS — M542 Cervicalgia: Secondary | ICD-10-CM | POA: Diagnosis not present

## 2016-11-21 DIAGNOSIS — G44209 Tension-type headache, unspecified, not intractable: Secondary | ICD-10-CM | POA: Diagnosis not present

## 2016-11-21 DIAGNOSIS — M62838 Other muscle spasm: Secondary | ICD-10-CM | POA: Diagnosis not present

## 2016-11-23 DIAGNOSIS — M542 Cervicalgia: Secondary | ICD-10-CM | POA: Diagnosis not present

## 2016-11-23 DIAGNOSIS — M47812 Spondylosis without myelopathy or radiculopathy, cervical region: Secondary | ICD-10-CM | POA: Diagnosis not present

## 2016-12-04 DIAGNOSIS — J449 Chronic obstructive pulmonary disease, unspecified: Secondary | ICD-10-CM | POA: Diagnosis not present

## 2016-12-07 DIAGNOSIS — M5126 Other intervertebral disc displacement, lumbar region: Secondary | ICD-10-CM | POA: Diagnosis not present

## 2016-12-07 DIAGNOSIS — M47812 Spondylosis without myelopathy or radiculopathy, cervical region: Secondary | ICD-10-CM | POA: Diagnosis not present

## 2016-12-07 DIAGNOSIS — M50223 Other cervical disc displacement at C6-C7 level: Secondary | ICD-10-CM | POA: Diagnosis not present

## 2016-12-07 DIAGNOSIS — M50321 Other cervical disc degeneration at C4-C5 level: Secondary | ICD-10-CM | POA: Diagnosis not present

## 2016-12-07 DIAGNOSIS — M4802 Spinal stenosis, cervical region: Secondary | ICD-10-CM | POA: Diagnosis not present

## 2016-12-22 DIAGNOSIS — Z6827 Body mass index (BMI) 27.0-27.9, adult: Secondary | ICD-10-CM | POA: Diagnosis not present

## 2016-12-22 DIAGNOSIS — I10 Essential (primary) hypertension: Secondary | ICD-10-CM | POA: Diagnosis not present

## 2016-12-22 DIAGNOSIS — M47812 Spondylosis without myelopathy or radiculopathy, cervical region: Secondary | ICD-10-CM | POA: Diagnosis not present

## 2017-01-01 DIAGNOSIS — M109 Gout, unspecified: Secondary | ICD-10-CM | POA: Diagnosis not present

## 2017-01-01 DIAGNOSIS — E785 Hyperlipidemia, unspecified: Secondary | ICD-10-CM | POA: Diagnosis not present

## 2017-01-01 DIAGNOSIS — I10 Essential (primary) hypertension: Secondary | ICD-10-CM | POA: Diagnosis not present

## 2017-01-01 DIAGNOSIS — R69 Illness, unspecified: Secondary | ICD-10-CM | POA: Diagnosis not present

## 2017-01-04 DIAGNOSIS — J449 Chronic obstructive pulmonary disease, unspecified: Secondary | ICD-10-CM | POA: Diagnosis not present

## 2017-01-05 DIAGNOSIS — I10 Essential (primary) hypertension: Secondary | ICD-10-CM | POA: Diagnosis not present

## 2017-01-05 DIAGNOSIS — J449 Chronic obstructive pulmonary disease, unspecified: Secondary | ICD-10-CM | POA: Diagnosis not present

## 2017-01-05 DIAGNOSIS — I359 Nonrheumatic aortic valve disorder, unspecified: Secondary | ICD-10-CM | POA: Diagnosis not present

## 2017-01-05 DIAGNOSIS — M109 Gout, unspecified: Secondary | ICD-10-CM | POA: Diagnosis not present

## 2017-01-05 DIAGNOSIS — Z950 Presence of cardiac pacemaker: Secondary | ICD-10-CM | POA: Diagnosis not present

## 2017-01-05 DIAGNOSIS — E785 Hyperlipidemia, unspecified: Secondary | ICD-10-CM | POA: Diagnosis not present

## 2017-01-05 DIAGNOSIS — E0590 Thyrotoxicosis, unspecified without thyrotoxic crisis or storm: Secondary | ICD-10-CM | POA: Diagnosis not present

## 2017-01-05 DIAGNOSIS — R69 Illness, unspecified: Secondary | ICD-10-CM | POA: Diagnosis not present

## 2017-01-05 DIAGNOSIS — Z6827 Body mass index (BMI) 27.0-27.9, adult: Secondary | ICD-10-CM | POA: Diagnosis not present

## 2017-01-06 DIAGNOSIS — J449 Chronic obstructive pulmonary disease, unspecified: Secondary | ICD-10-CM | POA: Diagnosis not present

## 2017-01-06 DIAGNOSIS — Z87891 Personal history of nicotine dependence: Secondary | ICD-10-CM | POA: Diagnosis not present

## 2017-01-06 DIAGNOSIS — Z9981 Dependence on supplemental oxygen: Secondary | ICD-10-CM | POA: Diagnosis not present

## 2017-01-06 DIAGNOSIS — M1A9XX0 Chronic gout, unspecified, without tophus (tophi): Secondary | ICD-10-CM | POA: Diagnosis not present

## 2017-01-06 DIAGNOSIS — Z79899 Other long term (current) drug therapy: Secondary | ICD-10-CM | POA: Diagnosis not present

## 2017-01-06 DIAGNOSIS — R600 Localized edema: Secondary | ICD-10-CM | POA: Diagnosis not present

## 2017-01-06 DIAGNOSIS — Z6827 Body mass index (BMI) 27.0-27.9, adult: Secondary | ICD-10-CM | POA: Diagnosis not present

## 2017-01-06 DIAGNOSIS — R233 Spontaneous ecchymoses: Secondary | ICD-10-CM | POA: Diagnosis not present

## 2017-01-06 DIAGNOSIS — H9110 Presbycusis, unspecified ear: Secondary | ICD-10-CM | POA: Diagnosis not present

## 2017-01-06 DIAGNOSIS — G43C1 Periodic headache syndromes in child or adult, intractable: Secondary | ICD-10-CM | POA: Diagnosis not present

## 2017-01-06 DIAGNOSIS — M1990 Unspecified osteoarthritis, unspecified site: Secondary | ICD-10-CM | POA: Diagnosis not present

## 2017-01-06 DIAGNOSIS — I10 Essential (primary) hypertension: Secondary | ICD-10-CM | POA: Diagnosis not present

## 2017-01-06 DIAGNOSIS — Z0000 Encounter for general adult medical examination without abnormal findings: Secondary | ICD-10-CM | POA: Diagnosis not present

## 2017-01-17 DIAGNOSIS — R300 Dysuria: Secondary | ICD-10-CM | POA: Diagnosis not present

## 2017-01-17 DIAGNOSIS — N3000 Acute cystitis without hematuria: Secondary | ICD-10-CM | POA: Diagnosis not present

## 2017-01-20 DIAGNOSIS — Z6828 Body mass index (BMI) 28.0-28.9, adult: Secondary | ICD-10-CM | POA: Diagnosis not present

## 2017-01-20 DIAGNOSIS — N390 Urinary tract infection, site not specified: Secondary | ICD-10-CM | POA: Diagnosis not present

## 2017-02-03 DIAGNOSIS — J449 Chronic obstructive pulmonary disease, unspecified: Secondary | ICD-10-CM | POA: Diagnosis not present

## 2017-02-11 DIAGNOSIS — R69 Illness, unspecified: Secondary | ICD-10-CM | POA: Diagnosis not present

## 2017-02-17 DIAGNOSIS — Z6827 Body mass index (BMI) 27.0-27.9, adult: Secondary | ICD-10-CM | POA: Diagnosis not present

## 2017-02-17 DIAGNOSIS — N390 Urinary tract infection, site not specified: Secondary | ICD-10-CM | POA: Diagnosis not present

## 2017-02-17 DIAGNOSIS — R3915 Urgency of urination: Secondary | ICD-10-CM | POA: Diagnosis not present

## 2017-02-28 DIAGNOSIS — I82402 Acute embolism and thrombosis of unspecified deep veins of left lower extremity: Secondary | ICD-10-CM | POA: Diagnosis not present

## 2017-02-28 DIAGNOSIS — I82412 Acute embolism and thrombosis of left femoral vein: Secondary | ICD-10-CM | POA: Diagnosis not present

## 2017-02-28 DIAGNOSIS — J449 Chronic obstructive pulmonary disease, unspecified: Secondary | ICD-10-CM | POA: Diagnosis not present

## 2017-02-28 DIAGNOSIS — M7989 Other specified soft tissue disorders: Secondary | ICD-10-CM | POA: Diagnosis not present

## 2017-03-01 DIAGNOSIS — Z950 Presence of cardiac pacemaker: Secondary | ICD-10-CM | POA: Diagnosis not present

## 2017-03-01 DIAGNOSIS — Z7901 Long term (current) use of anticoagulants: Secondary | ICD-10-CM | POA: Diagnosis not present

## 2017-03-01 DIAGNOSIS — M7989 Other specified soft tissue disorders: Secondary | ICD-10-CM | POA: Diagnosis not present

## 2017-03-01 DIAGNOSIS — I82432 Acute embolism and thrombosis of left popliteal vein: Secondary | ICD-10-CM | POA: Diagnosis not present

## 2017-03-01 DIAGNOSIS — I110 Hypertensive heart disease with heart failure: Secondary | ICD-10-CM | POA: Diagnosis not present

## 2017-03-01 DIAGNOSIS — Z79899 Other long term (current) drug therapy: Secondary | ICD-10-CM | POA: Diagnosis not present

## 2017-03-01 DIAGNOSIS — I82412 Acute embolism and thrombosis of left femoral vein: Secondary | ICD-10-CM | POA: Diagnosis not present

## 2017-03-01 DIAGNOSIS — Z7951 Long term (current) use of inhaled steroids: Secondary | ICD-10-CM | POA: Diagnosis not present

## 2017-03-01 DIAGNOSIS — J449 Chronic obstructive pulmonary disease, unspecified: Secondary | ICD-10-CM | POA: Diagnosis not present

## 2017-03-01 DIAGNOSIS — I82442 Acute embolism and thrombosis of left tibial vein: Secondary | ICD-10-CM | POA: Diagnosis not present

## 2017-03-01 DIAGNOSIS — I82402 Acute embolism and thrombosis of unspecified deep veins of left lower extremity: Secondary | ICD-10-CM | POA: Diagnosis not present

## 2017-03-01 DIAGNOSIS — I509 Heart failure, unspecified: Secondary | ICD-10-CM | POA: Diagnosis not present

## 2017-03-02 DIAGNOSIS — I447 Left bundle-branch block, unspecified: Secondary | ICD-10-CM | POA: Diagnosis not present

## 2017-03-02 DIAGNOSIS — I824Y2 Acute embolism and thrombosis of unspecified deep veins of left proximal lower extremity: Secondary | ICD-10-CM | POA: Diagnosis not present

## 2017-03-02 DIAGNOSIS — I82412 Acute embolism and thrombosis of left femoral vein: Secondary | ICD-10-CM | POA: Diagnosis not present

## 2017-03-02 DIAGNOSIS — J449 Chronic obstructive pulmonary disease, unspecified: Secondary | ICD-10-CM | POA: Diagnosis not present

## 2017-03-04 DIAGNOSIS — N390 Urinary tract infection, site not specified: Secondary | ICD-10-CM | POA: Diagnosis not present

## 2017-03-04 DIAGNOSIS — Z6828 Body mass index (BMI) 28.0-28.9, adult: Secondary | ICD-10-CM | POA: Diagnosis not present

## 2017-03-04 DIAGNOSIS — I82402 Acute embolism and thrombosis of unspecified deep veins of left lower extremity: Secondary | ICD-10-CM | POA: Diagnosis not present

## 2017-03-06 DIAGNOSIS — J449 Chronic obstructive pulmonary disease, unspecified: Secondary | ICD-10-CM | POA: Diagnosis not present

## 2017-03-11 DIAGNOSIS — I82402 Acute embolism and thrombosis of unspecified deep veins of left lower extremity: Secondary | ICD-10-CM | POA: Diagnosis not present

## 2017-03-11 DIAGNOSIS — R5383 Other fatigue: Secondary | ICD-10-CM | POA: Diagnosis not present

## 2017-03-11 DIAGNOSIS — Z7901 Long term (current) use of anticoagulants: Secondary | ICD-10-CM | POA: Diagnosis not present

## 2017-03-11 DIAGNOSIS — W57XXXA Bitten or stung by nonvenomous insect and other nonvenomous arthropods, initial encounter: Secondary | ICD-10-CM | POA: Diagnosis not present

## 2017-03-29 DIAGNOSIS — J449 Chronic obstructive pulmonary disease, unspecified: Secondary | ICD-10-CM | POA: Diagnosis not present

## 2017-03-29 DIAGNOSIS — J209 Acute bronchitis, unspecified: Secondary | ICD-10-CM | POA: Diagnosis not present

## 2017-04-01 DIAGNOSIS — I509 Heart failure, unspecified: Secondary | ICD-10-CM | POA: Diagnosis not present

## 2017-04-01 DIAGNOSIS — Z45018 Encounter for adjustment and management of other part of cardiac pacemaker: Secondary | ICD-10-CM | POA: Diagnosis not present

## 2017-04-05 DIAGNOSIS — J449 Chronic obstructive pulmonary disease, unspecified: Secondary | ICD-10-CM | POA: Diagnosis not present

## 2017-04-06 DIAGNOSIS — E876 Hypokalemia: Secondary | ICD-10-CM | POA: Diagnosis not present

## 2017-04-07 DIAGNOSIS — B370 Candidal stomatitis: Secondary | ICD-10-CM | POA: Diagnosis not present

## 2017-04-07 DIAGNOSIS — B3781 Candidal esophagitis: Secondary | ICD-10-CM | POA: Diagnosis not present

## 2017-04-07 DIAGNOSIS — H2703 Aphakia, bilateral: Secondary | ICD-10-CM | POA: Diagnosis not present

## 2017-04-24 DIAGNOSIS — R69 Illness, unspecified: Secondary | ICD-10-CM | POA: Diagnosis not present

## 2017-05-06 DIAGNOSIS — J449 Chronic obstructive pulmonary disease, unspecified: Secondary | ICD-10-CM | POA: Diagnosis not present

## 2017-05-21 DIAGNOSIS — M791 Myalgia: Secondary | ICD-10-CM | POA: Diagnosis not present

## 2017-05-21 DIAGNOSIS — M5136 Other intervertebral disc degeneration, lumbar region: Secondary | ICD-10-CM | POA: Diagnosis not present

## 2017-05-21 DIAGNOSIS — M25552 Pain in left hip: Secondary | ICD-10-CM | POA: Diagnosis not present

## 2017-06-04 DIAGNOSIS — Z952 Presence of prosthetic heart valve: Secondary | ICD-10-CM | POA: Diagnosis not present

## 2017-06-04 DIAGNOSIS — Z86718 Personal history of other venous thrombosis and embolism: Secondary | ICD-10-CM | POA: Diagnosis not present

## 2017-06-04 DIAGNOSIS — R69 Illness, unspecified: Secondary | ICD-10-CM | POA: Diagnosis not present

## 2017-06-04 DIAGNOSIS — S2231XA Fracture of one rib, right side, initial encounter for closed fracture: Secondary | ICD-10-CM | POA: Diagnosis not present

## 2017-06-04 DIAGNOSIS — I2510 Atherosclerotic heart disease of native coronary artery without angina pectoris: Secondary | ICD-10-CM | POA: Diagnosis not present

## 2017-06-04 DIAGNOSIS — Z87891 Personal history of nicotine dependence: Secondary | ICD-10-CM | POA: Diagnosis not present

## 2017-06-04 DIAGNOSIS — I82812 Embolism and thrombosis of superficial veins of left lower extremities: Secondary | ICD-10-CM | POA: Diagnosis not present

## 2017-06-04 DIAGNOSIS — R0789 Other chest pain: Secondary | ICD-10-CM | POA: Diagnosis not present

## 2017-06-04 DIAGNOSIS — F17211 Nicotine dependence, cigarettes, in remission: Secondary | ICD-10-CM | POA: Diagnosis not present

## 2017-06-04 DIAGNOSIS — I493 Ventricular premature depolarization: Secondary | ICD-10-CM | POA: Diagnosis not present

## 2017-06-04 DIAGNOSIS — I82402 Acute embolism and thrombosis of unspecified deep veins of left lower extremity: Secondary | ICD-10-CM | POA: Diagnosis not present

## 2017-06-04 DIAGNOSIS — R942 Abnormal results of pulmonary function studies: Secondary | ICD-10-CM | POA: Diagnosis not present

## 2017-06-04 DIAGNOSIS — Z7901 Long term (current) use of anticoagulants: Secondary | ICD-10-CM | POA: Diagnosis not present

## 2017-06-04 DIAGNOSIS — R748 Abnormal levels of other serum enzymes: Secondary | ICD-10-CM | POA: Diagnosis not present

## 2017-06-04 DIAGNOSIS — R918 Other nonspecific abnormal finding of lung field: Secondary | ICD-10-CM | POA: Diagnosis not present

## 2017-06-04 DIAGNOSIS — I82409 Acute embolism and thrombosis of unspecified deep veins of unspecified lower extremity: Secondary | ICD-10-CM | POA: Diagnosis not present

## 2017-06-04 DIAGNOSIS — I2782 Chronic pulmonary embolism: Secondary | ICD-10-CM | POA: Diagnosis not present

## 2017-06-04 DIAGNOSIS — I2699 Other pulmonary embolism without acute cor pulmonale: Secondary | ICD-10-CM | POA: Diagnosis not present

## 2017-06-04 DIAGNOSIS — I350 Nonrheumatic aortic (valve) stenosis: Secondary | ICD-10-CM | POA: Diagnosis not present

## 2017-06-04 DIAGNOSIS — I509 Heart failure, unspecified: Secondary | ICD-10-CM | POA: Diagnosis not present

## 2017-06-04 DIAGNOSIS — R911 Solitary pulmonary nodule: Secondary | ICD-10-CM | POA: Diagnosis not present

## 2017-06-04 DIAGNOSIS — R778 Other specified abnormalities of plasma proteins: Secondary | ICD-10-CM | POA: Diagnosis not present

## 2017-06-04 DIAGNOSIS — I110 Hypertensive heart disease with heart failure: Secondary | ICD-10-CM | POA: Diagnosis not present

## 2017-06-04 DIAGNOSIS — J449 Chronic obstructive pulmonary disease, unspecified: Secondary | ICD-10-CM | POA: Diagnosis not present

## 2017-06-04 DIAGNOSIS — R0602 Shortness of breath: Secondary | ICD-10-CM | POA: Diagnosis not present

## 2017-06-04 DIAGNOSIS — Z9181 History of falling: Secondary | ICD-10-CM | POA: Diagnosis not present

## 2017-06-04 DIAGNOSIS — E876 Hypokalemia: Secondary | ICD-10-CM | POA: Diagnosis not present

## 2017-06-04 DIAGNOSIS — I447 Left bundle-branch block, unspecified: Secondary | ICD-10-CM | POA: Diagnosis not present

## 2017-06-04 DIAGNOSIS — I10 Essential (primary) hypertension: Secondary | ICD-10-CM | POA: Diagnosis not present

## 2017-06-04 DIAGNOSIS — S2241XA Multiple fractures of ribs, right side, initial encounter for closed fracture: Secondary | ICD-10-CM | POA: Diagnosis not present

## 2017-06-04 DIAGNOSIS — I82412 Acute embolism and thrombosis of left femoral vein: Secondary | ICD-10-CM | POA: Diagnosis not present

## 2017-06-04 DIAGNOSIS — I340 Nonrheumatic mitral (valve) insufficiency: Secondary | ICD-10-CM | POA: Diagnosis not present

## 2017-06-04 DIAGNOSIS — I491 Atrial premature depolarization: Secondary | ICD-10-CM | POA: Diagnosis not present

## 2017-06-04 DIAGNOSIS — R071 Chest pain on breathing: Secondary | ICD-10-CM | POA: Diagnosis not present

## 2017-06-04 DIAGNOSIS — Z953 Presence of xenogenic heart valve: Secondary | ICD-10-CM | POA: Diagnosis not present

## 2017-06-04 DIAGNOSIS — I82512 Chronic embolism and thrombosis of left femoral vein: Secondary | ICD-10-CM | POA: Diagnosis not present

## 2017-06-04 DIAGNOSIS — I82502 Chronic embolism and thrombosis of unspecified deep veins of left lower extremity: Secondary | ICD-10-CM | POA: Diagnosis not present

## 2017-06-09 DIAGNOSIS — M5481 Occipital neuralgia: Secondary | ICD-10-CM | POA: Diagnosis not present

## 2017-06-11 DIAGNOSIS — Z7901 Long term (current) use of anticoagulants: Secondary | ICD-10-CM | POA: Diagnosis not present

## 2017-06-11 DIAGNOSIS — J449 Chronic obstructive pulmonary disease, unspecified: Secondary | ICD-10-CM | POA: Diagnosis not present

## 2017-06-11 DIAGNOSIS — Z7951 Long term (current) use of inhaled steroids: Secondary | ICD-10-CM | POA: Diagnosis not present

## 2017-06-11 DIAGNOSIS — R918 Other nonspecific abnormal finding of lung field: Secondary | ICD-10-CM | POA: Diagnosis not present

## 2017-06-11 DIAGNOSIS — I359 Nonrheumatic aortic valve disorder, unspecified: Secondary | ICD-10-CM | POA: Diagnosis not present

## 2017-06-11 DIAGNOSIS — Z9981 Dependence on supplemental oxygen: Secondary | ICD-10-CM | POA: Diagnosis not present

## 2017-06-11 DIAGNOSIS — R5383 Other fatigue: Secondary | ICD-10-CM | POA: Diagnosis not present

## 2017-06-11 DIAGNOSIS — Z23 Encounter for immunization: Secondary | ICD-10-CM | POA: Diagnosis not present

## 2017-06-11 DIAGNOSIS — I82402 Acute embolism and thrombosis of unspecified deep veins of left lower extremity: Secondary | ICD-10-CM | POA: Diagnosis not present

## 2017-06-11 DIAGNOSIS — E876 Hypokalemia: Secondary | ICD-10-CM | POA: Diagnosis not present

## 2017-06-14 DIAGNOSIS — M5481 Occipital neuralgia: Secondary | ICD-10-CM | POA: Diagnosis not present

## 2017-06-15 DIAGNOSIS — M542 Cervicalgia: Secondary | ICD-10-CM | POA: Diagnosis not present

## 2017-06-18 DIAGNOSIS — M5481 Occipital neuralgia: Secondary | ICD-10-CM | POA: Diagnosis not present

## 2017-06-18 DIAGNOSIS — J449 Chronic obstructive pulmonary disease, unspecified: Secondary | ICD-10-CM | POA: Diagnosis not present

## 2017-06-18 DIAGNOSIS — R911 Solitary pulmonary nodule: Secondary | ICD-10-CM | POA: Diagnosis not present

## 2017-06-18 DIAGNOSIS — G4452 New daily persistent headache (NDPH): Secondary | ICD-10-CM | POA: Diagnosis not present

## 2017-06-19 DIAGNOSIS — M542 Cervicalgia: Secondary | ICD-10-CM | POA: Diagnosis not present

## 2017-06-24 DIAGNOSIS — R911 Solitary pulmonary nodule: Secondary | ICD-10-CM | POA: Diagnosis not present

## 2017-06-24 DIAGNOSIS — J0100 Acute maxillary sinusitis, unspecified: Secondary | ICD-10-CM | POA: Diagnosis not present

## 2017-06-24 DIAGNOSIS — M899 Disorder of bone, unspecified: Secondary | ICD-10-CM | POA: Diagnosis not present

## 2017-06-26 DIAGNOSIS — R000 Tachycardia, unspecified: Secondary | ICD-10-CM | POA: Diagnosis not present

## 2017-06-26 DIAGNOSIS — Z9981 Dependence on supplemental oxygen: Secondary | ICD-10-CM | POA: Diagnosis not present

## 2017-06-26 DIAGNOSIS — J209 Acute bronchitis, unspecified: Secondary | ICD-10-CM | POA: Diagnosis not present

## 2017-06-26 DIAGNOSIS — I2782 Chronic pulmonary embolism: Secondary | ICD-10-CM | POA: Diagnosis not present

## 2017-06-26 DIAGNOSIS — Z86711 Personal history of pulmonary embolism: Secondary | ICD-10-CM | POA: Diagnosis not present

## 2017-06-26 DIAGNOSIS — I700 Atherosclerosis of aorta: Secondary | ICD-10-CM | POA: Diagnosis not present

## 2017-06-26 DIAGNOSIS — R0602 Shortness of breath: Secondary | ICD-10-CM | POA: Diagnosis not present

## 2017-06-26 DIAGNOSIS — R05 Cough: Secondary | ICD-10-CM | POA: Diagnosis not present

## 2017-06-26 DIAGNOSIS — I350 Nonrheumatic aortic (valve) stenosis: Secondary | ICD-10-CM | POA: Diagnosis not present

## 2017-06-26 DIAGNOSIS — Z86718 Personal history of other venous thrombosis and embolism: Secondary | ICD-10-CM | POA: Diagnosis not present

## 2017-06-26 DIAGNOSIS — I509 Heart failure, unspecified: Secondary | ICD-10-CM | POA: Diagnosis not present

## 2017-06-26 DIAGNOSIS — R079 Chest pain, unspecified: Secondary | ICD-10-CM | POA: Diagnosis not present

## 2017-06-26 DIAGNOSIS — I82402 Acute embolism and thrombosis of unspecified deep veins of left lower extremity: Secondary | ICD-10-CM | POA: Diagnosis not present

## 2017-06-26 DIAGNOSIS — J189 Pneumonia, unspecified organism: Secondary | ICD-10-CM | POA: Diagnosis not present

## 2017-06-26 DIAGNOSIS — I447 Left bundle-branch block, unspecified: Secondary | ICD-10-CM | POA: Diagnosis not present

## 2017-06-26 DIAGNOSIS — Z7901 Long term (current) use of anticoagulants: Secondary | ICD-10-CM | POA: Diagnosis not present

## 2017-06-26 DIAGNOSIS — Y95 Nosocomial condition: Secondary | ICD-10-CM | POA: Diagnosis not present

## 2017-06-26 DIAGNOSIS — J9621 Acute and chronic respiratory failure with hypoxia: Secondary | ICD-10-CM | POA: Diagnosis not present

## 2017-06-26 DIAGNOSIS — R778 Other specified abnormalities of plasma proteins: Secondary | ICD-10-CM | POA: Diagnosis not present

## 2017-06-26 DIAGNOSIS — J441 Chronic obstructive pulmonary disease with (acute) exacerbation: Secondary | ICD-10-CM | POA: Diagnosis not present

## 2017-06-26 DIAGNOSIS — I110 Hypertensive heart disease with heart failure: Secondary | ICD-10-CM | POA: Diagnosis not present

## 2017-06-26 DIAGNOSIS — J440 Chronic obstructive pulmonary disease with acute lower respiratory infection: Secondary | ICD-10-CM | POA: Diagnosis not present

## 2017-06-27 DIAGNOSIS — Z9981 Dependence on supplemental oxygen: Secondary | ICD-10-CM | POA: Diagnosis not present

## 2017-06-27 DIAGNOSIS — I110 Hypertensive heart disease with heart failure: Secondary | ICD-10-CM | POA: Diagnosis not present

## 2017-06-27 DIAGNOSIS — J9621 Acute and chronic respiratory failure with hypoxia: Secondary | ICD-10-CM | POA: Diagnosis not present

## 2017-06-27 DIAGNOSIS — I509 Heart failure, unspecified: Secondary | ICD-10-CM | POA: Diagnosis not present

## 2017-06-27 DIAGNOSIS — I350 Nonrheumatic aortic (valve) stenosis: Secondary | ICD-10-CM | POA: Diagnosis not present

## 2017-06-27 DIAGNOSIS — J209 Acute bronchitis, unspecified: Secondary | ICD-10-CM | POA: Diagnosis not present

## 2017-06-27 DIAGNOSIS — Z86711 Personal history of pulmonary embolism: Secondary | ICD-10-CM | POA: Diagnosis not present

## 2017-06-27 DIAGNOSIS — Z7901 Long term (current) use of anticoagulants: Secondary | ICD-10-CM | POA: Diagnosis not present

## 2017-06-27 DIAGNOSIS — J449 Chronic obstructive pulmonary disease, unspecified: Secondary | ICD-10-CM | POA: Diagnosis not present

## 2017-06-27 DIAGNOSIS — Z86718 Personal history of other venous thrombosis and embolism: Secondary | ICD-10-CM | POA: Diagnosis not present

## 2017-06-30 DIAGNOSIS — J449 Chronic obstructive pulmonary disease, unspecified: Secondary | ICD-10-CM | POA: Diagnosis not present

## 2017-06-30 DIAGNOSIS — R918 Other nonspecific abnormal finding of lung field: Secondary | ICD-10-CM | POA: Diagnosis not present

## 2017-06-30 DIAGNOSIS — I10 Essential (primary) hypertension: Secondary | ICD-10-CM | POA: Diagnosis not present

## 2017-07-01 DIAGNOSIS — R911 Solitary pulmonary nodule: Secondary | ICD-10-CM | POA: Diagnosis not present

## 2017-07-04 DIAGNOSIS — Z951 Presence of aortocoronary bypass graft: Secondary | ICD-10-CM | POA: Diagnosis not present

## 2017-07-04 DIAGNOSIS — I471 Supraventricular tachycardia: Secondary | ICD-10-CM | POA: Diagnosis not present

## 2017-07-04 DIAGNOSIS — R1110 Vomiting, unspecified: Secondary | ICD-10-CM | POA: Diagnosis not present

## 2017-07-04 DIAGNOSIS — I2510 Atherosclerotic heart disease of native coronary artery without angina pectoris: Secondary | ICD-10-CM | POA: Diagnosis not present

## 2017-07-04 DIAGNOSIS — E860 Dehydration: Secondary | ICD-10-CM | POA: Diagnosis not present

## 2017-07-04 DIAGNOSIS — R7989 Other specified abnormal findings of blood chemistry: Secondary | ICD-10-CM | POA: Diagnosis not present

## 2017-07-04 DIAGNOSIS — R112 Nausea with vomiting, unspecified: Secondary | ICD-10-CM | POA: Diagnosis not present

## 2017-07-04 DIAGNOSIS — Z87891 Personal history of nicotine dependence: Secondary | ICD-10-CM | POA: Diagnosis not present

## 2017-07-04 DIAGNOSIS — I509 Heart failure, unspecified: Secondary | ICD-10-CM | POA: Diagnosis not present

## 2017-07-04 DIAGNOSIS — J449 Chronic obstructive pulmonary disease, unspecified: Secondary | ICD-10-CM | POA: Diagnosis not present

## 2017-07-04 DIAGNOSIS — I110 Hypertensive heart disease with heart failure: Secondary | ICD-10-CM | POA: Diagnosis not present

## 2017-07-04 DIAGNOSIS — K529 Noninfective gastroenteritis and colitis, unspecified: Secondary | ICD-10-CM | POA: Diagnosis not present

## 2017-07-04 DIAGNOSIS — N289 Disorder of kidney and ureter, unspecified: Secondary | ICD-10-CM | POA: Diagnosis not present

## 2017-07-05 DIAGNOSIS — E0590 Thyrotoxicosis, unspecified without thyrotoxic crisis or storm: Secondary | ICD-10-CM | POA: Diagnosis not present

## 2017-07-05 DIAGNOSIS — R112 Nausea with vomiting, unspecified: Secondary | ICD-10-CM | POA: Diagnosis not present

## 2017-07-05 DIAGNOSIS — R1110 Vomiting, unspecified: Secondary | ICD-10-CM | POA: Diagnosis not present

## 2017-07-05 DIAGNOSIS — R000 Tachycardia, unspecified: Secondary | ICD-10-CM | POA: Diagnosis not present

## 2017-07-05 DIAGNOSIS — I10 Essential (primary) hypertension: Secondary | ICD-10-CM | POA: Diagnosis not present

## 2017-07-05 DIAGNOSIS — E860 Dehydration: Secondary | ICD-10-CM | POA: Diagnosis not present

## 2017-07-05 DIAGNOSIS — R748 Abnormal levels of other serum enzymes: Secondary | ICD-10-CM | POA: Diagnosis not present

## 2017-07-05 DIAGNOSIS — J9620 Acute and chronic respiratory failure, unspecified whether with hypoxia or hypercapnia: Secondary | ICD-10-CM | POA: Diagnosis not present

## 2017-07-06 DIAGNOSIS — J449 Chronic obstructive pulmonary disease, unspecified: Secondary | ICD-10-CM | POA: Diagnosis not present

## 2017-07-09 DIAGNOSIS — R9431 Abnormal electrocardiogram [ECG] [EKG]: Secondary | ICD-10-CM | POA: Diagnosis not present

## 2017-07-09 DIAGNOSIS — Z952 Presence of prosthetic heart valve: Secondary | ICD-10-CM | POA: Diagnosis not present

## 2017-07-09 DIAGNOSIS — I350 Nonrheumatic aortic (valve) stenosis: Secondary | ICD-10-CM | POA: Diagnosis not present

## 2017-07-09 DIAGNOSIS — R000 Tachycardia, unspecified: Secondary | ICD-10-CM | POA: Diagnosis not present

## 2017-07-09 DIAGNOSIS — I10 Essential (primary) hypertension: Secondary | ICD-10-CM | POA: Diagnosis not present

## 2017-07-09 DIAGNOSIS — J449 Chronic obstructive pulmonary disease, unspecified: Secondary | ICD-10-CM | POA: Diagnosis not present

## 2017-07-09 DIAGNOSIS — R002 Palpitations: Secondary | ICD-10-CM | POA: Diagnosis not present

## 2017-07-09 DIAGNOSIS — I447 Left bundle-branch block, unspecified: Secondary | ICD-10-CM | POA: Diagnosis not present

## 2017-07-13 DIAGNOSIS — D696 Thrombocytopenia, unspecified: Secondary | ICD-10-CM | POA: Diagnosis not present

## 2017-07-13 DIAGNOSIS — R911 Solitary pulmonary nodule: Secondary | ICD-10-CM | POA: Diagnosis not present

## 2017-07-13 DIAGNOSIS — Z86711 Personal history of pulmonary embolism: Secondary | ICD-10-CM | POA: Diagnosis not present

## 2017-07-13 DIAGNOSIS — R5383 Other fatigue: Secondary | ICD-10-CM | POA: Diagnosis not present

## 2017-07-13 DIAGNOSIS — R531 Weakness: Secondary | ICD-10-CM | POA: Diagnosis not present

## 2017-07-13 DIAGNOSIS — Z7901 Long term (current) use of anticoagulants: Secondary | ICD-10-CM | POA: Diagnosis not present

## 2017-07-19 DIAGNOSIS — N390 Urinary tract infection, site not specified: Secondary | ICD-10-CM | POA: Diagnosis not present

## 2017-07-19 DIAGNOSIS — R5383 Other fatigue: Secondary | ICD-10-CM | POA: Diagnosis not present

## 2017-07-19 DIAGNOSIS — M6281 Muscle weakness (generalized): Secondary | ICD-10-CM | POA: Diagnosis not present

## 2017-07-19 DIAGNOSIS — J449 Chronic obstructive pulmonary disease, unspecified: Secondary | ICD-10-CM | POA: Diagnosis not present

## 2017-07-19 DIAGNOSIS — Z9981 Dependence on supplemental oxygen: Secondary | ICD-10-CM | POA: Diagnosis not present

## 2017-07-23 DIAGNOSIS — R001 Bradycardia, unspecified: Secondary | ICD-10-CM | POA: Diagnosis not present

## 2017-07-23 DIAGNOSIS — I350 Nonrheumatic aortic (valve) stenosis: Secondary | ICD-10-CM | POA: Diagnosis not present

## 2017-07-23 DIAGNOSIS — R5383 Other fatigue: Secondary | ICD-10-CM | POA: Diagnosis not present

## 2017-07-23 DIAGNOSIS — Z87898 Personal history of other specified conditions: Secondary | ICD-10-CM | POA: Diagnosis not present

## 2017-07-23 DIAGNOSIS — I491 Atrial premature depolarization: Secondary | ICD-10-CM | POA: Diagnosis not present

## 2017-07-23 DIAGNOSIS — R7989 Other specified abnormal findings of blood chemistry: Secondary | ICD-10-CM | POA: Diagnosis not present

## 2017-07-23 DIAGNOSIS — R002 Palpitations: Secondary | ICD-10-CM | POA: Diagnosis not present

## 2017-07-23 DIAGNOSIS — R000 Tachycardia, unspecified: Secondary | ICD-10-CM | POA: Diagnosis not present

## 2017-07-23 DIAGNOSIS — I110 Hypertensive heart disease with heart failure: Secondary | ICD-10-CM | POA: Diagnosis not present

## 2017-07-23 DIAGNOSIS — E0580 Other thyrotoxicosis without thyrotoxic crisis or storm: Secondary | ICD-10-CM | POA: Diagnosis not present

## 2017-07-23 DIAGNOSIS — I4891 Unspecified atrial fibrillation: Secondary | ICD-10-CM | POA: Diagnosis not present

## 2017-07-23 DIAGNOSIS — I447 Left bundle-branch block, unspecified: Secondary | ICD-10-CM | POA: Diagnosis not present

## 2017-07-24 DIAGNOSIS — I5032 Chronic diastolic (congestive) heart failure: Secondary | ICD-10-CM | POA: Diagnosis not present

## 2017-07-24 DIAGNOSIS — I4891 Unspecified atrial fibrillation: Secondary | ICD-10-CM | POA: Diagnosis not present

## 2017-07-24 DIAGNOSIS — R7989 Other specified abnormal findings of blood chemistry: Secondary | ICD-10-CM | POA: Diagnosis not present

## 2017-07-24 DIAGNOSIS — Z87898 Personal history of other specified conditions: Secondary | ICD-10-CM | POA: Diagnosis not present

## 2017-07-24 DIAGNOSIS — I110 Hypertensive heart disease with heart failure: Secondary | ICD-10-CM | POA: Diagnosis not present

## 2017-07-24 DIAGNOSIS — E0520 Thyrotoxicosis with toxic multinodular goiter without thyrotoxic crisis or storm: Secondary | ICD-10-CM | POA: Diagnosis not present

## 2017-07-24 DIAGNOSIS — R748 Abnormal levels of other serum enzymes: Secondary | ICD-10-CM | POA: Diagnosis not present

## 2017-07-24 DIAGNOSIS — R001 Bradycardia, unspecified: Secondary | ICD-10-CM | POA: Diagnosis not present

## 2017-07-24 DIAGNOSIS — E785 Hyperlipidemia, unspecified: Secondary | ICD-10-CM | POA: Diagnosis not present

## 2017-07-24 DIAGNOSIS — W1839XA Other fall on same level, initial encounter: Secondary | ICD-10-CM | POA: Diagnosis not present

## 2017-07-24 DIAGNOSIS — R000 Tachycardia, unspecified: Secondary | ICD-10-CM | POA: Diagnosis not present

## 2017-07-24 DIAGNOSIS — I447 Left bundle-branch block, unspecified: Secondary | ICD-10-CM | POA: Diagnosis not present

## 2017-07-24 DIAGNOSIS — R5383 Other fatigue: Secondary | ICD-10-CM | POA: Diagnosis not present

## 2017-07-24 DIAGNOSIS — I248 Other forms of acute ischemic heart disease: Secondary | ICD-10-CM | POA: Diagnosis not present

## 2017-07-24 DIAGNOSIS — Z86718 Personal history of other venous thrombosis and embolism: Secondary | ICD-10-CM | POA: Diagnosis not present

## 2017-07-24 DIAGNOSIS — R9431 Abnormal electrocardiogram [ECG] [EKG]: Secondary | ICD-10-CM | POA: Diagnosis not present

## 2017-07-24 DIAGNOSIS — Z7982 Long term (current) use of aspirin: Secondary | ICD-10-CM | POA: Diagnosis not present

## 2017-07-24 DIAGNOSIS — E038 Other specified hypothyroidism: Secondary | ICD-10-CM | POA: Diagnosis not present

## 2017-07-24 DIAGNOSIS — E0590 Thyrotoxicosis, unspecified without thyrotoxic crisis or storm: Secondary | ICD-10-CM | POA: Diagnosis not present

## 2017-07-24 DIAGNOSIS — N3000 Acute cystitis without hematuria: Secondary | ICD-10-CM | POA: Diagnosis not present

## 2017-07-24 DIAGNOSIS — I493 Ventricular premature depolarization: Secondary | ICD-10-CM | POA: Diagnosis not present

## 2017-07-24 DIAGNOSIS — I491 Atrial premature depolarization: Secondary | ICD-10-CM | POA: Diagnosis not present

## 2017-07-24 DIAGNOSIS — B9620 Unspecified Escherichia coli [E. coli] as the cause of diseases classified elsewhere: Secondary | ICD-10-CM | POA: Diagnosis not present

## 2017-07-24 DIAGNOSIS — J449 Chronic obstructive pulmonary disease, unspecified: Secondary | ICD-10-CM | POA: Diagnosis not present

## 2017-07-24 DIAGNOSIS — Z1629 Resistance to other single specified antibiotic: Secondary | ICD-10-CM | POA: Diagnosis not present

## 2017-07-24 DIAGNOSIS — I2510 Atherosclerotic heart disease of native coronary artery without angina pectoris: Secondary | ICD-10-CM | POA: Diagnosis not present

## 2017-07-24 DIAGNOSIS — Z9181 History of falling: Secondary | ICD-10-CM | POA: Diagnosis not present

## 2017-07-24 DIAGNOSIS — I350 Nonrheumatic aortic (valve) stenosis: Secondary | ICD-10-CM | POA: Diagnosis not present

## 2017-07-24 DIAGNOSIS — Z7901 Long term (current) use of anticoagulants: Secondary | ICD-10-CM | POA: Diagnosis not present

## 2017-07-24 DIAGNOSIS — E0580 Other thyrotoxicosis without thyrotoxic crisis or storm: Secondary | ICD-10-CM | POA: Diagnosis not present

## 2017-07-24 DIAGNOSIS — R002 Palpitations: Secondary | ICD-10-CM | POA: Diagnosis not present

## 2017-07-25 DIAGNOSIS — I2510 Atherosclerotic heart disease of native coronary artery without angina pectoris: Secondary | ICD-10-CM | POA: Diagnosis not present

## 2017-07-25 DIAGNOSIS — R000 Tachycardia, unspecified: Secondary | ICD-10-CM | POA: Diagnosis not present

## 2017-07-25 DIAGNOSIS — N3000 Acute cystitis without hematuria: Secondary | ICD-10-CM | POA: Diagnosis not present

## 2017-07-25 DIAGNOSIS — Z7982 Long term (current) use of aspirin: Secondary | ICD-10-CM | POA: Diagnosis not present

## 2017-07-25 DIAGNOSIS — I350 Nonrheumatic aortic (valve) stenosis: Secondary | ICD-10-CM | POA: Diagnosis not present

## 2017-07-25 DIAGNOSIS — E0590 Thyrotoxicosis, unspecified without thyrotoxic crisis or storm: Secondary | ICD-10-CM | POA: Diagnosis not present

## 2017-07-25 DIAGNOSIS — R001 Bradycardia, unspecified: Secondary | ICD-10-CM | POA: Diagnosis not present

## 2017-07-25 DIAGNOSIS — Z86718 Personal history of other venous thrombosis and embolism: Secondary | ICD-10-CM | POA: Diagnosis not present

## 2017-07-25 DIAGNOSIS — R7989 Other specified abnormal findings of blood chemistry: Secondary | ICD-10-CM | POA: Diagnosis not present

## 2017-07-25 DIAGNOSIS — I110 Hypertensive heart disease with heart failure: Secondary | ICD-10-CM | POA: Diagnosis not present

## 2017-07-26 DIAGNOSIS — Z86718 Personal history of other venous thrombosis and embolism: Secondary | ICD-10-CM | POA: Diagnosis not present

## 2017-07-26 DIAGNOSIS — R7989 Other specified abnormal findings of blood chemistry: Secondary | ICD-10-CM | POA: Diagnosis not present

## 2017-07-26 DIAGNOSIS — I350 Nonrheumatic aortic (valve) stenosis: Secondary | ICD-10-CM | POA: Diagnosis not present

## 2017-07-26 DIAGNOSIS — I2510 Atherosclerotic heart disease of native coronary artery without angina pectoris: Secondary | ICD-10-CM | POA: Diagnosis not present

## 2017-07-26 DIAGNOSIS — Z87898 Personal history of other specified conditions: Secondary | ICD-10-CM | POA: Diagnosis not present

## 2017-07-26 DIAGNOSIS — Z7982 Long term (current) use of aspirin: Secondary | ICD-10-CM | POA: Diagnosis not present

## 2017-07-26 DIAGNOSIS — Z9181 History of falling: Secondary | ICD-10-CM | POA: Diagnosis not present

## 2017-07-26 DIAGNOSIS — E0580 Other thyrotoxicosis without thyrotoxic crisis or storm: Secondary | ICD-10-CM | POA: Diagnosis not present

## 2017-07-26 DIAGNOSIS — E038 Other specified hypothyroidism: Secondary | ICD-10-CM | POA: Diagnosis not present

## 2017-07-26 DIAGNOSIS — N3000 Acute cystitis without hematuria: Secondary | ICD-10-CM | POA: Diagnosis not present

## 2017-07-27 DIAGNOSIS — N3000 Acute cystitis without hematuria: Secondary | ICD-10-CM | POA: Diagnosis not present

## 2017-07-27 DIAGNOSIS — E0580 Other thyrotoxicosis without thyrotoxic crisis or storm: Secondary | ICD-10-CM | POA: Diagnosis not present

## 2017-07-27 DIAGNOSIS — I350 Nonrheumatic aortic (valve) stenosis: Secondary | ICD-10-CM | POA: Diagnosis not present

## 2017-07-27 DIAGNOSIS — I2510 Atherosclerotic heart disease of native coronary artery without angina pectoris: Secondary | ICD-10-CM | POA: Diagnosis not present

## 2017-07-27 DIAGNOSIS — E038 Other specified hypothyroidism: Secondary | ICD-10-CM | POA: Diagnosis not present

## 2017-07-27 DIAGNOSIS — Z9181 History of falling: Secondary | ICD-10-CM | POA: Diagnosis not present

## 2017-07-27 DIAGNOSIS — Z7982 Long term (current) use of aspirin: Secondary | ICD-10-CM | POA: Diagnosis not present

## 2017-07-27 DIAGNOSIS — Z87898 Personal history of other specified conditions: Secondary | ICD-10-CM | POA: Diagnosis not present

## 2017-07-27 DIAGNOSIS — Z86718 Personal history of other venous thrombosis and embolism: Secondary | ICD-10-CM | POA: Diagnosis not present

## 2017-07-27 DIAGNOSIS — R7989 Other specified abnormal findings of blood chemistry: Secondary | ICD-10-CM | POA: Diagnosis not present

## 2017-07-28 DIAGNOSIS — I2510 Atherosclerotic heart disease of native coronary artery without angina pectoris: Secondary | ICD-10-CM | POA: Diagnosis not present

## 2017-07-28 DIAGNOSIS — Z9181 History of falling: Secondary | ICD-10-CM | POA: Diagnosis not present

## 2017-07-28 DIAGNOSIS — Z87898 Personal history of other specified conditions: Secondary | ICD-10-CM | POA: Diagnosis not present

## 2017-07-28 DIAGNOSIS — I350 Nonrheumatic aortic (valve) stenosis: Secondary | ICD-10-CM | POA: Diagnosis not present

## 2017-07-28 DIAGNOSIS — R7989 Other specified abnormal findings of blood chemistry: Secondary | ICD-10-CM | POA: Diagnosis not present

## 2017-07-28 DIAGNOSIS — E038 Other specified hypothyroidism: Secondary | ICD-10-CM | POA: Diagnosis not present

## 2017-07-28 DIAGNOSIS — Z7982 Long term (current) use of aspirin: Secondary | ICD-10-CM | POA: Diagnosis not present

## 2017-07-28 DIAGNOSIS — E0580 Other thyrotoxicosis without thyrotoxic crisis or storm: Secondary | ICD-10-CM | POA: Diagnosis not present

## 2017-07-28 DIAGNOSIS — N3000 Acute cystitis without hematuria: Secondary | ICD-10-CM | POA: Diagnosis not present

## 2017-07-28 DIAGNOSIS — Z86718 Personal history of other venous thrombosis and embolism: Secondary | ICD-10-CM | POA: Diagnosis not present

## 2017-07-29 DIAGNOSIS — R7989 Other specified abnormal findings of blood chemistry: Secondary | ICD-10-CM | POA: Diagnosis not present

## 2017-07-29 DIAGNOSIS — Z87898 Personal history of other specified conditions: Secondary | ICD-10-CM | POA: Diagnosis not present

## 2017-07-29 DIAGNOSIS — Z7982 Long term (current) use of aspirin: Secondary | ICD-10-CM | POA: Diagnosis not present

## 2017-07-29 DIAGNOSIS — Z86718 Personal history of other venous thrombosis and embolism: Secondary | ICD-10-CM | POA: Diagnosis not present

## 2017-07-29 DIAGNOSIS — E038 Other specified hypothyroidism: Secondary | ICD-10-CM | POA: Diagnosis not present

## 2017-07-29 DIAGNOSIS — I350 Nonrheumatic aortic (valve) stenosis: Secondary | ICD-10-CM | POA: Diagnosis not present

## 2017-07-29 DIAGNOSIS — E0580 Other thyrotoxicosis without thyrotoxic crisis or storm: Secondary | ICD-10-CM | POA: Diagnosis not present

## 2017-07-29 DIAGNOSIS — I2510 Atherosclerotic heart disease of native coronary artery without angina pectoris: Secondary | ICD-10-CM | POA: Diagnosis not present

## 2017-07-29 DIAGNOSIS — Z9181 History of falling: Secondary | ICD-10-CM | POA: Diagnosis not present

## 2017-07-29 DIAGNOSIS — N3000 Acute cystitis without hematuria: Secondary | ICD-10-CM | POA: Diagnosis not present

## 2017-07-30 DIAGNOSIS — I2510 Atherosclerotic heart disease of native coronary artery without angina pectoris: Secondary | ICD-10-CM | POA: Diagnosis not present

## 2017-07-30 DIAGNOSIS — Z7982 Long term (current) use of aspirin: Secondary | ICD-10-CM | POA: Diagnosis not present

## 2017-07-30 DIAGNOSIS — J449 Chronic obstructive pulmonary disease, unspecified: Secondary | ICD-10-CM | POA: Diagnosis not present

## 2017-07-30 DIAGNOSIS — R7989 Other specified abnormal findings of blood chemistry: Secondary | ICD-10-CM | POA: Diagnosis not present

## 2017-07-30 DIAGNOSIS — I350 Nonrheumatic aortic (valve) stenosis: Secondary | ICD-10-CM | POA: Diagnosis not present

## 2017-07-30 DIAGNOSIS — N3000 Acute cystitis without hematuria: Secondary | ICD-10-CM | POA: Diagnosis not present

## 2017-07-30 DIAGNOSIS — E038 Other specified hypothyroidism: Secondary | ICD-10-CM | POA: Diagnosis not present

## 2017-07-30 DIAGNOSIS — E0580 Other thyrotoxicosis without thyrotoxic crisis or storm: Secondary | ICD-10-CM | POA: Diagnosis not present

## 2017-07-30 DIAGNOSIS — Z86718 Personal history of other venous thrombosis and embolism: Secondary | ICD-10-CM | POA: Diagnosis not present

## 2017-07-30 DIAGNOSIS — Z9181 History of falling: Secondary | ICD-10-CM | POA: Diagnosis not present

## 2017-07-30 DIAGNOSIS — Z87898 Personal history of other specified conditions: Secondary | ICD-10-CM | POA: Diagnosis not present

## 2017-08-02 DIAGNOSIS — I350 Nonrheumatic aortic (valve) stenosis: Secondary | ICD-10-CM | POA: Diagnosis not present

## 2017-08-02 DIAGNOSIS — I110 Hypertensive heart disease with heart failure: Secondary | ICD-10-CM | POA: Diagnosis not present

## 2017-08-02 DIAGNOSIS — G4452 New daily persistent headache (NDPH): Secondary | ICD-10-CM | POA: Diagnosis not present

## 2017-08-02 DIAGNOSIS — M109 Gout, unspecified: Secondary | ICD-10-CM | POA: Diagnosis not present

## 2017-08-02 DIAGNOSIS — I4891 Unspecified atrial fibrillation: Secondary | ICD-10-CM | POA: Diagnosis not present

## 2017-08-02 DIAGNOSIS — J449 Chronic obstructive pulmonary disease, unspecified: Secondary | ICD-10-CM | POA: Diagnosis not present

## 2017-08-02 DIAGNOSIS — I5032 Chronic diastolic (congestive) heart failure: Secondary | ICD-10-CM | POA: Diagnosis not present

## 2017-08-02 DIAGNOSIS — M5481 Occipital neuralgia: Secondary | ICD-10-CM | POA: Diagnosis not present

## 2017-08-02 DIAGNOSIS — M1990 Unspecified osteoarthritis, unspecified site: Secondary | ICD-10-CM | POA: Diagnosis not present

## 2017-08-02 DIAGNOSIS — I2510 Atherosclerotic heart disease of native coronary artery without angina pectoris: Secondary | ICD-10-CM | POA: Diagnosis not present

## 2017-08-03 DIAGNOSIS — I350 Nonrheumatic aortic (valve) stenosis: Secondary | ICD-10-CM | POA: Diagnosis not present

## 2017-08-03 DIAGNOSIS — M5481 Occipital neuralgia: Secondary | ICD-10-CM | POA: Diagnosis not present

## 2017-08-03 DIAGNOSIS — G4452 New daily persistent headache (NDPH): Secondary | ICD-10-CM | POA: Diagnosis not present

## 2017-08-03 DIAGNOSIS — M1990 Unspecified osteoarthritis, unspecified site: Secondary | ICD-10-CM | POA: Diagnosis not present

## 2017-08-03 DIAGNOSIS — I2510 Atherosclerotic heart disease of native coronary artery without angina pectoris: Secondary | ICD-10-CM | POA: Diagnosis not present

## 2017-08-03 DIAGNOSIS — M109 Gout, unspecified: Secondary | ICD-10-CM | POA: Diagnosis not present

## 2017-08-03 DIAGNOSIS — J449 Chronic obstructive pulmonary disease, unspecified: Secondary | ICD-10-CM | POA: Diagnosis not present

## 2017-08-03 DIAGNOSIS — I110 Hypertensive heart disease with heart failure: Secondary | ICD-10-CM | POA: Diagnosis not present

## 2017-08-03 DIAGNOSIS — I5032 Chronic diastolic (congestive) heart failure: Secondary | ICD-10-CM | POA: Diagnosis not present

## 2017-08-03 DIAGNOSIS — I4891 Unspecified atrial fibrillation: Secondary | ICD-10-CM | POA: Diagnosis not present

## 2017-08-04 DIAGNOSIS — J449 Chronic obstructive pulmonary disease, unspecified: Secondary | ICD-10-CM | POA: Diagnosis not present

## 2017-08-04 DIAGNOSIS — I110 Hypertensive heart disease with heart failure: Secondary | ICD-10-CM | POA: Diagnosis not present

## 2017-08-04 DIAGNOSIS — M109 Gout, unspecified: Secondary | ICD-10-CM | POA: Diagnosis not present

## 2017-08-04 DIAGNOSIS — M5481 Occipital neuralgia: Secondary | ICD-10-CM | POA: Diagnosis not present

## 2017-08-04 DIAGNOSIS — I2510 Atherosclerotic heart disease of native coronary artery without angina pectoris: Secondary | ICD-10-CM | POA: Diagnosis not present

## 2017-08-04 DIAGNOSIS — G4452 New daily persistent headache (NDPH): Secondary | ICD-10-CM | POA: Diagnosis not present

## 2017-08-04 DIAGNOSIS — M1990 Unspecified osteoarthritis, unspecified site: Secondary | ICD-10-CM | POA: Diagnosis not present

## 2017-08-04 DIAGNOSIS — I4891 Unspecified atrial fibrillation: Secondary | ICD-10-CM | POA: Diagnosis not present

## 2017-08-04 DIAGNOSIS — I350 Nonrheumatic aortic (valve) stenosis: Secondary | ICD-10-CM | POA: Diagnosis not present

## 2017-08-04 DIAGNOSIS — I5032 Chronic diastolic (congestive) heart failure: Secondary | ICD-10-CM | POA: Diagnosis not present

## 2017-08-06 DIAGNOSIS — M109 Gout, unspecified: Secondary | ICD-10-CM | POA: Diagnosis not present

## 2017-08-06 DIAGNOSIS — I110 Hypertensive heart disease with heart failure: Secondary | ICD-10-CM | POA: Diagnosis not present

## 2017-08-06 DIAGNOSIS — I4891 Unspecified atrial fibrillation: Secondary | ICD-10-CM | POA: Diagnosis not present

## 2017-08-06 DIAGNOSIS — M1990 Unspecified osteoarthritis, unspecified site: Secondary | ICD-10-CM | POA: Diagnosis not present

## 2017-08-06 DIAGNOSIS — I5032 Chronic diastolic (congestive) heart failure: Secondary | ICD-10-CM | POA: Diagnosis not present

## 2017-08-06 DIAGNOSIS — I2510 Atherosclerotic heart disease of native coronary artery without angina pectoris: Secondary | ICD-10-CM | POA: Diagnosis not present

## 2017-08-06 DIAGNOSIS — I350 Nonrheumatic aortic (valve) stenosis: Secondary | ICD-10-CM | POA: Diagnosis not present

## 2017-08-06 DIAGNOSIS — J449 Chronic obstructive pulmonary disease, unspecified: Secondary | ICD-10-CM | POA: Diagnosis not present

## 2017-08-06 DIAGNOSIS — M5481 Occipital neuralgia: Secondary | ICD-10-CM | POA: Diagnosis not present

## 2017-08-06 DIAGNOSIS — G4452 New daily persistent headache (NDPH): Secondary | ICD-10-CM | POA: Diagnosis not present

## 2017-08-07 DIAGNOSIS — N3000 Acute cystitis without hematuria: Secondary | ICD-10-CM | POA: Diagnosis not present

## 2017-08-07 DIAGNOSIS — N3001 Acute cystitis with hematuria: Secondary | ICD-10-CM | POA: Diagnosis not present

## 2017-08-10 DIAGNOSIS — I2510 Atherosclerotic heart disease of native coronary artery without angina pectoris: Secondary | ICD-10-CM | POA: Diagnosis not present

## 2017-08-10 DIAGNOSIS — I5032 Chronic diastolic (congestive) heart failure: Secondary | ICD-10-CM | POA: Diagnosis not present

## 2017-08-10 DIAGNOSIS — M5481 Occipital neuralgia: Secondary | ICD-10-CM | POA: Diagnosis not present

## 2017-08-10 DIAGNOSIS — J449 Chronic obstructive pulmonary disease, unspecified: Secondary | ICD-10-CM | POA: Diagnosis not present

## 2017-08-10 DIAGNOSIS — I110 Hypertensive heart disease with heart failure: Secondary | ICD-10-CM | POA: Diagnosis not present

## 2017-08-10 DIAGNOSIS — I4891 Unspecified atrial fibrillation: Secondary | ICD-10-CM | POA: Diagnosis not present

## 2017-08-10 DIAGNOSIS — M1990 Unspecified osteoarthritis, unspecified site: Secondary | ICD-10-CM | POA: Diagnosis not present

## 2017-08-10 DIAGNOSIS — I350 Nonrheumatic aortic (valve) stenosis: Secondary | ICD-10-CM | POA: Diagnosis not present

## 2017-08-10 DIAGNOSIS — M109 Gout, unspecified: Secondary | ICD-10-CM | POA: Diagnosis not present

## 2017-08-10 DIAGNOSIS — G4452 New daily persistent headache (NDPH): Secondary | ICD-10-CM | POA: Diagnosis not present

## 2017-08-13 DIAGNOSIS — I4891 Unspecified atrial fibrillation: Secondary | ICD-10-CM | POA: Diagnosis not present

## 2017-08-13 DIAGNOSIS — I350 Nonrheumatic aortic (valve) stenosis: Secondary | ICD-10-CM | POA: Diagnosis not present

## 2017-08-13 DIAGNOSIS — M1990 Unspecified osteoarthritis, unspecified site: Secondary | ICD-10-CM | POA: Diagnosis not present

## 2017-08-13 DIAGNOSIS — I110 Hypertensive heart disease with heart failure: Secondary | ICD-10-CM | POA: Diagnosis not present

## 2017-08-13 DIAGNOSIS — M109 Gout, unspecified: Secondary | ICD-10-CM | POA: Diagnosis not present

## 2017-08-13 DIAGNOSIS — G4452 New daily persistent headache (NDPH): Secondary | ICD-10-CM | POA: Diagnosis not present

## 2017-08-13 DIAGNOSIS — I2510 Atherosclerotic heart disease of native coronary artery without angina pectoris: Secondary | ICD-10-CM | POA: Diagnosis not present

## 2017-08-13 DIAGNOSIS — M5481 Occipital neuralgia: Secondary | ICD-10-CM | POA: Diagnosis not present

## 2017-08-13 DIAGNOSIS — I5032 Chronic diastolic (congestive) heart failure: Secondary | ICD-10-CM | POA: Diagnosis not present

## 2017-08-13 DIAGNOSIS — J449 Chronic obstructive pulmonary disease, unspecified: Secondary | ICD-10-CM | POA: Diagnosis not present

## 2017-08-16 DIAGNOSIS — I4891 Unspecified atrial fibrillation: Secondary | ICD-10-CM | POA: Diagnosis not present

## 2017-08-16 DIAGNOSIS — I5032 Chronic diastolic (congestive) heart failure: Secondary | ICD-10-CM | POA: Diagnosis not present

## 2017-08-16 DIAGNOSIS — I350 Nonrheumatic aortic (valve) stenosis: Secondary | ICD-10-CM | POA: Diagnosis not present

## 2017-08-16 DIAGNOSIS — M109 Gout, unspecified: Secondary | ICD-10-CM | POA: Diagnosis not present

## 2017-08-16 DIAGNOSIS — J449 Chronic obstructive pulmonary disease, unspecified: Secondary | ICD-10-CM | POA: Diagnosis not present

## 2017-08-16 DIAGNOSIS — G4452 New daily persistent headache (NDPH): Secondary | ICD-10-CM | POA: Diagnosis not present

## 2017-08-16 DIAGNOSIS — M5481 Occipital neuralgia: Secondary | ICD-10-CM | POA: Diagnosis not present

## 2017-08-16 DIAGNOSIS — I2510 Atherosclerotic heart disease of native coronary artery without angina pectoris: Secondary | ICD-10-CM | POA: Diagnosis not present

## 2017-08-16 DIAGNOSIS — I110 Hypertensive heart disease with heart failure: Secondary | ICD-10-CM | POA: Diagnosis not present

## 2017-08-16 DIAGNOSIS — M1990 Unspecified osteoarthritis, unspecified site: Secondary | ICD-10-CM | POA: Diagnosis not present

## 2017-08-18 DIAGNOSIS — M109 Gout, unspecified: Secondary | ICD-10-CM | POA: Diagnosis not present

## 2017-08-18 DIAGNOSIS — I2510 Atherosclerotic heart disease of native coronary artery without angina pectoris: Secondary | ICD-10-CM | POA: Diagnosis not present

## 2017-08-18 DIAGNOSIS — J449 Chronic obstructive pulmonary disease, unspecified: Secondary | ICD-10-CM | POA: Diagnosis not present

## 2017-08-18 DIAGNOSIS — G4452 New daily persistent headache (NDPH): Secondary | ICD-10-CM | POA: Diagnosis not present

## 2017-08-18 DIAGNOSIS — I350 Nonrheumatic aortic (valve) stenosis: Secondary | ICD-10-CM | POA: Diagnosis not present

## 2017-08-18 DIAGNOSIS — I110 Hypertensive heart disease with heart failure: Secondary | ICD-10-CM | POA: Diagnosis not present

## 2017-08-18 DIAGNOSIS — M1990 Unspecified osteoarthritis, unspecified site: Secondary | ICD-10-CM | POA: Diagnosis not present

## 2017-08-18 DIAGNOSIS — I5032 Chronic diastolic (congestive) heart failure: Secondary | ICD-10-CM | POA: Diagnosis not present

## 2017-08-18 DIAGNOSIS — I4891 Unspecified atrial fibrillation: Secondary | ICD-10-CM | POA: Diagnosis not present

## 2017-08-18 DIAGNOSIS — M5481 Occipital neuralgia: Secondary | ICD-10-CM | POA: Diagnosis not present

## 2017-08-19 DIAGNOSIS — J449 Chronic obstructive pulmonary disease, unspecified: Secondary | ICD-10-CM | POA: Diagnosis not present

## 2017-08-19 DIAGNOSIS — Z9981 Dependence on supplemental oxygen: Secondary | ICD-10-CM | POA: Diagnosis not present

## 2017-08-19 DIAGNOSIS — N390 Urinary tract infection, site not specified: Secondary | ICD-10-CM | POA: Diagnosis not present

## 2017-08-19 DIAGNOSIS — Z6828 Body mass index (BMI) 28.0-28.9, adult: Secondary | ICD-10-CM | POA: Diagnosis not present

## 2017-08-19 DIAGNOSIS — E0590 Thyrotoxicosis, unspecified without thyrotoxic crisis or storm: Secondary | ICD-10-CM | POA: Diagnosis not present

## 2017-08-20 DIAGNOSIS — N390 Urinary tract infection, site not specified: Secondary | ICD-10-CM | POA: Diagnosis not present

## 2017-08-23 DIAGNOSIS — I110 Hypertensive heart disease with heart failure: Secondary | ICD-10-CM | POA: Diagnosis not present

## 2017-08-23 DIAGNOSIS — I10 Essential (primary) hypertension: Secondary | ICD-10-CM | POA: Diagnosis not present

## 2017-08-23 DIAGNOSIS — M109 Gout, unspecified: Secondary | ICD-10-CM | POA: Diagnosis not present

## 2017-08-23 DIAGNOSIS — I350 Nonrheumatic aortic (valve) stenosis: Secondary | ICD-10-CM | POA: Diagnosis not present

## 2017-08-23 DIAGNOSIS — M1990 Unspecified osteoarthritis, unspecified site: Secondary | ICD-10-CM | POA: Diagnosis not present

## 2017-08-23 DIAGNOSIS — I4891 Unspecified atrial fibrillation: Secondary | ICD-10-CM | POA: Diagnosis not present

## 2017-08-23 DIAGNOSIS — M5481 Occipital neuralgia: Secondary | ICD-10-CM | POA: Diagnosis not present

## 2017-08-23 DIAGNOSIS — G4452 New daily persistent headache (NDPH): Secondary | ICD-10-CM | POA: Diagnosis not present

## 2017-08-23 DIAGNOSIS — I5032 Chronic diastolic (congestive) heart failure: Secondary | ICD-10-CM | POA: Diagnosis not present

## 2017-08-23 DIAGNOSIS — J449 Chronic obstructive pulmonary disease, unspecified: Secondary | ICD-10-CM | POA: Diagnosis not present

## 2017-08-23 DIAGNOSIS — I2510 Atherosclerotic heart disease of native coronary artery without angina pectoris: Secondary | ICD-10-CM | POA: Diagnosis not present

## 2017-08-26 DIAGNOSIS — E042 Nontoxic multinodular goiter: Secondary | ICD-10-CM | POA: Diagnosis not present

## 2017-08-26 DIAGNOSIS — E279 Disorder of adrenal gland, unspecified: Secondary | ICD-10-CM | POA: Diagnosis not present

## 2017-08-26 DIAGNOSIS — E0590 Thyrotoxicosis, unspecified without thyrotoxic crisis or storm: Secondary | ICD-10-CM | POA: Diagnosis not present

## 2017-08-27 DIAGNOSIS — M109 Gout, unspecified: Secondary | ICD-10-CM | POA: Diagnosis not present

## 2017-08-27 DIAGNOSIS — I350 Nonrheumatic aortic (valve) stenosis: Secondary | ICD-10-CM | POA: Diagnosis not present

## 2017-08-27 DIAGNOSIS — I110 Hypertensive heart disease with heart failure: Secondary | ICD-10-CM | POA: Diagnosis not present

## 2017-08-27 DIAGNOSIS — I5032 Chronic diastolic (congestive) heart failure: Secondary | ICD-10-CM | POA: Diagnosis not present

## 2017-08-27 DIAGNOSIS — J449 Chronic obstructive pulmonary disease, unspecified: Secondary | ICD-10-CM | POA: Diagnosis not present

## 2017-08-27 DIAGNOSIS — G4452 New daily persistent headache (NDPH): Secondary | ICD-10-CM | POA: Diagnosis not present

## 2017-08-27 DIAGNOSIS — M1990 Unspecified osteoarthritis, unspecified site: Secondary | ICD-10-CM | POA: Diagnosis not present

## 2017-08-27 DIAGNOSIS — I4891 Unspecified atrial fibrillation: Secondary | ICD-10-CM | POA: Diagnosis not present

## 2017-08-27 DIAGNOSIS — M5481 Occipital neuralgia: Secondary | ICD-10-CM | POA: Diagnosis not present

## 2017-08-27 DIAGNOSIS — I2510 Atherosclerotic heart disease of native coronary artery without angina pectoris: Secondary | ICD-10-CM | POA: Diagnosis not present

## 2017-08-31 DIAGNOSIS — I4891 Unspecified atrial fibrillation: Secondary | ICD-10-CM | POA: Diagnosis not present

## 2017-08-31 DIAGNOSIS — J449 Chronic obstructive pulmonary disease, unspecified: Secondary | ICD-10-CM | POA: Diagnosis not present

## 2017-08-31 DIAGNOSIS — I110 Hypertensive heart disease with heart failure: Secondary | ICD-10-CM | POA: Diagnosis not present

## 2017-08-31 DIAGNOSIS — M1990 Unspecified osteoarthritis, unspecified site: Secondary | ICD-10-CM | POA: Diagnosis not present

## 2017-08-31 DIAGNOSIS — I350 Nonrheumatic aortic (valve) stenosis: Secondary | ICD-10-CM | POA: Diagnosis not present

## 2017-08-31 DIAGNOSIS — I5032 Chronic diastolic (congestive) heart failure: Secondary | ICD-10-CM | POA: Diagnosis not present

## 2017-08-31 DIAGNOSIS — M5481 Occipital neuralgia: Secondary | ICD-10-CM | POA: Diagnosis not present

## 2017-08-31 DIAGNOSIS — G4452 New daily persistent headache (NDPH): Secondary | ICD-10-CM | POA: Diagnosis not present

## 2017-08-31 DIAGNOSIS — I2510 Atherosclerotic heart disease of native coronary artery without angina pectoris: Secondary | ICD-10-CM | POA: Diagnosis not present

## 2017-08-31 DIAGNOSIS — M109 Gout, unspecified: Secondary | ICD-10-CM | POA: Diagnosis not present

## 2017-09-01 DIAGNOSIS — G4452 New daily persistent headache (NDPH): Secondary | ICD-10-CM | POA: Diagnosis not present

## 2017-09-01 DIAGNOSIS — J449 Chronic obstructive pulmonary disease, unspecified: Secondary | ICD-10-CM | POA: Diagnosis not present

## 2017-09-01 DIAGNOSIS — M1990 Unspecified osteoarthritis, unspecified site: Secondary | ICD-10-CM | POA: Diagnosis not present

## 2017-09-01 DIAGNOSIS — M109 Gout, unspecified: Secondary | ICD-10-CM | POA: Diagnosis not present

## 2017-09-01 DIAGNOSIS — M5481 Occipital neuralgia: Secondary | ICD-10-CM | POA: Diagnosis not present

## 2017-09-01 DIAGNOSIS — I5032 Chronic diastolic (congestive) heart failure: Secondary | ICD-10-CM | POA: Diagnosis not present

## 2017-09-01 DIAGNOSIS — I110 Hypertensive heart disease with heart failure: Secondary | ICD-10-CM | POA: Diagnosis not present

## 2017-09-01 DIAGNOSIS — I4891 Unspecified atrial fibrillation: Secondary | ICD-10-CM | POA: Diagnosis not present

## 2017-09-01 DIAGNOSIS — I2510 Atherosclerotic heart disease of native coronary artery without angina pectoris: Secondary | ICD-10-CM | POA: Diagnosis not present

## 2017-09-01 DIAGNOSIS — I350 Nonrheumatic aortic (valve) stenosis: Secondary | ICD-10-CM | POA: Diagnosis not present

## 2017-09-05 DIAGNOSIS — J449 Chronic obstructive pulmonary disease, unspecified: Secondary | ICD-10-CM | POA: Diagnosis not present

## 2017-09-09 DIAGNOSIS — E042 Nontoxic multinodular goiter: Secondary | ICD-10-CM | POA: Diagnosis not present

## 2017-09-20 DIAGNOSIS — R300 Dysuria: Secondary | ICD-10-CM | POA: Diagnosis not present

## 2017-09-20 DIAGNOSIS — B370 Candidal stomatitis: Secondary | ICD-10-CM | POA: Diagnosis not present

## 2017-09-20 DIAGNOSIS — B3781 Candidal esophagitis: Secondary | ICD-10-CM | POA: Diagnosis not present

## 2017-09-22 DIAGNOSIS — R300 Dysuria: Secondary | ICD-10-CM | POA: Diagnosis not present

## 2017-10-01 DIAGNOSIS — R1084 Generalized abdominal pain: Secondary | ICD-10-CM | POA: Diagnosis not present

## 2017-10-05 DIAGNOSIS — K5900 Constipation, unspecified: Secondary | ICD-10-CM | POA: Diagnosis not present

## 2017-10-21 DIAGNOSIS — E0590 Thyrotoxicosis, unspecified without thyrotoxic crisis or storm: Secondary | ICD-10-CM | POA: Diagnosis not present

## 2017-10-29 DIAGNOSIS — Z79899 Other long term (current) drug therapy: Secondary | ICD-10-CM | POA: Diagnosis not present

## 2017-10-29 DIAGNOSIS — E538 Deficiency of other specified B group vitamins: Secondary | ICD-10-CM | POA: Diagnosis not present

## 2017-10-29 DIAGNOSIS — Z006 Encounter for examination for normal comparison and control in clinical research program: Secondary | ICD-10-CM | POA: Diagnosis not present

## 2017-10-29 DIAGNOSIS — R300 Dysuria: Secondary | ICD-10-CM | POA: Diagnosis not present

## 2017-10-29 DIAGNOSIS — R82998 Other abnormal findings in urine: Secondary | ICD-10-CM | POA: Diagnosis not present

## 2017-10-29 DIAGNOSIS — R5383 Other fatigue: Secondary | ICD-10-CM | POA: Diagnosis not present

## 2017-11-06 DIAGNOSIS — J449 Chronic obstructive pulmonary disease, unspecified: Secondary | ICD-10-CM | POA: Diagnosis not present

## 2017-11-08 DIAGNOSIS — R262 Difficulty in walking, not elsewhere classified: Secondary | ICD-10-CM | POA: Diagnosis not present

## 2017-11-08 DIAGNOSIS — R293 Abnormal posture: Secondary | ICD-10-CM | POA: Diagnosis not present

## 2017-11-08 DIAGNOSIS — M6281 Muscle weakness (generalized): Secondary | ICD-10-CM | POA: Diagnosis not present

## 2017-11-08 DIAGNOSIS — R5383 Other fatigue: Secondary | ICD-10-CM | POA: Diagnosis not present

## 2017-11-10 DIAGNOSIS — R262 Difficulty in walking, not elsewhere classified: Secondary | ICD-10-CM | POA: Diagnosis not present

## 2017-11-10 DIAGNOSIS — R5383 Other fatigue: Secondary | ICD-10-CM | POA: Diagnosis not present

## 2017-11-10 DIAGNOSIS — R293 Abnormal posture: Secondary | ICD-10-CM | POA: Diagnosis not present

## 2017-11-10 DIAGNOSIS — M6281 Muscle weakness (generalized): Secondary | ICD-10-CM | POA: Diagnosis not present

## 2017-11-11 DIAGNOSIS — J111 Influenza due to unidentified influenza virus with other respiratory manifestations: Secondary | ICD-10-CM | POA: Diagnosis not present

## 2017-11-16 DIAGNOSIS — M6281 Muscle weakness (generalized): Secondary | ICD-10-CM | POA: Diagnosis not present

## 2017-11-16 DIAGNOSIS — R5383 Other fatigue: Secondary | ICD-10-CM | POA: Diagnosis not present

## 2017-11-16 DIAGNOSIS — R262 Difficulty in walking, not elsewhere classified: Secondary | ICD-10-CM | POA: Diagnosis not present

## 2017-11-16 DIAGNOSIS — R293 Abnormal posture: Secondary | ICD-10-CM | POA: Diagnosis not present

## 2017-11-17 DIAGNOSIS — R262 Difficulty in walking, not elsewhere classified: Secondary | ICD-10-CM | POA: Diagnosis not present

## 2017-11-17 DIAGNOSIS — R5383 Other fatigue: Secondary | ICD-10-CM | POA: Diagnosis not present

## 2017-11-17 DIAGNOSIS — M6281 Muscle weakness (generalized): Secondary | ICD-10-CM | POA: Diagnosis not present

## 2017-11-17 DIAGNOSIS — R293 Abnormal posture: Secondary | ICD-10-CM | POA: Diagnosis not present

## 2017-11-19 DIAGNOSIS — I10 Essential (primary) hypertension: Secondary | ICD-10-CM | POA: Diagnosis not present

## 2017-11-19 DIAGNOSIS — R262 Difficulty in walking, not elsewhere classified: Secondary | ICD-10-CM | POA: Diagnosis not present

## 2017-11-19 DIAGNOSIS — M6281 Muscle weakness (generalized): Secondary | ICD-10-CM | POA: Diagnosis not present

## 2017-11-19 DIAGNOSIS — Z954 Presence of other heart-valve replacement: Secondary | ICD-10-CM | POA: Diagnosis not present

## 2017-11-19 DIAGNOSIS — M545 Low back pain: Secondary | ICD-10-CM | POA: Diagnosis not present

## 2017-11-19 DIAGNOSIS — R5383 Other fatigue: Secondary | ICD-10-CM | POA: Diagnosis not present

## 2017-11-19 DIAGNOSIS — R293 Abnormal posture: Secondary | ICD-10-CM | POA: Diagnosis not present

## 2017-11-22 DIAGNOSIS — R293 Abnormal posture: Secondary | ICD-10-CM | POA: Diagnosis not present

## 2017-11-22 DIAGNOSIS — M545 Low back pain: Secondary | ICD-10-CM | POA: Diagnosis not present

## 2017-11-22 DIAGNOSIS — M6281 Muscle weakness (generalized): Secondary | ICD-10-CM | POA: Diagnosis not present

## 2017-11-22 DIAGNOSIS — I10 Essential (primary) hypertension: Secondary | ICD-10-CM | POA: Diagnosis not present

## 2017-11-22 DIAGNOSIS — R5383 Other fatigue: Secondary | ICD-10-CM | POA: Diagnosis not present

## 2017-11-22 DIAGNOSIS — Z954 Presence of other heart-valve replacement: Secondary | ICD-10-CM | POA: Diagnosis not present

## 2017-11-22 DIAGNOSIS — R262 Difficulty in walking, not elsewhere classified: Secondary | ICD-10-CM | POA: Diagnosis not present

## 2017-11-24 DIAGNOSIS — I10 Essential (primary) hypertension: Secondary | ICD-10-CM | POA: Diagnosis not present

## 2017-11-24 DIAGNOSIS — R5383 Other fatigue: Secondary | ICD-10-CM | POA: Diagnosis not present

## 2017-11-24 DIAGNOSIS — M6281 Muscle weakness (generalized): Secondary | ICD-10-CM | POA: Diagnosis not present

## 2017-11-24 DIAGNOSIS — R262 Difficulty in walking, not elsewhere classified: Secondary | ICD-10-CM | POA: Diagnosis not present

## 2017-11-24 DIAGNOSIS — M545 Low back pain: Secondary | ICD-10-CM | POA: Diagnosis not present

## 2017-11-24 DIAGNOSIS — R293 Abnormal posture: Secondary | ICD-10-CM | POA: Diagnosis not present

## 2017-11-24 DIAGNOSIS — Z954 Presence of other heart-valve replacement: Secondary | ICD-10-CM | POA: Diagnosis not present

## 2017-11-25 DIAGNOSIS — E279 Disorder of adrenal gland, unspecified: Secondary | ICD-10-CM | POA: Diagnosis not present

## 2017-11-25 DIAGNOSIS — E0590 Thyrotoxicosis, unspecified without thyrotoxic crisis or storm: Secondary | ICD-10-CM | POA: Diagnosis not present

## 2017-11-25 DIAGNOSIS — I2782 Chronic pulmonary embolism: Secondary | ICD-10-CM | POA: Diagnosis not present

## 2017-11-25 DIAGNOSIS — Z952 Presence of prosthetic heart valve: Secondary | ICD-10-CM | POA: Diagnosis not present

## 2017-11-25 DIAGNOSIS — E042 Nontoxic multinodular goiter: Secondary | ICD-10-CM | POA: Diagnosis not present

## 2017-11-25 DIAGNOSIS — I10 Essential (primary) hypertension: Secondary | ICD-10-CM | POA: Diagnosis not present

## 2017-11-25 DIAGNOSIS — J449 Chronic obstructive pulmonary disease, unspecified: Secondary | ICD-10-CM | POA: Diagnosis not present

## 2017-11-29 DIAGNOSIS — R609 Edema, unspecified: Secondary | ICD-10-CM | POA: Diagnosis not present

## 2017-12-01 DIAGNOSIS — R5383 Other fatigue: Secondary | ICD-10-CM | POA: Diagnosis not present

## 2017-12-01 DIAGNOSIS — Z954 Presence of other heart-valve replacement: Secondary | ICD-10-CM | POA: Diagnosis not present

## 2017-12-01 DIAGNOSIS — R293 Abnormal posture: Secondary | ICD-10-CM | POA: Diagnosis not present

## 2017-12-01 DIAGNOSIS — M6281 Muscle weakness (generalized): Secondary | ICD-10-CM | POA: Diagnosis not present

## 2017-12-01 DIAGNOSIS — R262 Difficulty in walking, not elsewhere classified: Secondary | ICD-10-CM | POA: Diagnosis not present

## 2017-12-01 DIAGNOSIS — I10 Essential (primary) hypertension: Secondary | ICD-10-CM | POA: Diagnosis not present

## 2017-12-01 DIAGNOSIS — M545 Low back pain: Secondary | ICD-10-CM | POA: Diagnosis not present

## 2017-12-04 DIAGNOSIS — J449 Chronic obstructive pulmonary disease, unspecified: Secondary | ICD-10-CM | POA: Diagnosis not present

## 2017-12-06 DIAGNOSIS — R5383 Other fatigue: Secondary | ICD-10-CM | POA: Diagnosis not present

## 2017-12-06 DIAGNOSIS — R262 Difficulty in walking, not elsewhere classified: Secondary | ICD-10-CM | POA: Diagnosis not present

## 2017-12-06 DIAGNOSIS — R293 Abnormal posture: Secondary | ICD-10-CM | POA: Diagnosis not present

## 2017-12-06 DIAGNOSIS — M545 Low back pain: Secondary | ICD-10-CM | POA: Diagnosis not present

## 2017-12-06 DIAGNOSIS — Z954 Presence of other heart-valve replacement: Secondary | ICD-10-CM | POA: Diagnosis not present

## 2017-12-06 DIAGNOSIS — M6281 Muscle weakness (generalized): Secondary | ICD-10-CM | POA: Diagnosis not present

## 2017-12-06 DIAGNOSIS — I10 Essential (primary) hypertension: Secondary | ICD-10-CM | POA: Diagnosis not present

## 2017-12-08 DIAGNOSIS — M6281 Muscle weakness (generalized): Secondary | ICD-10-CM | POA: Diagnosis not present

## 2017-12-08 DIAGNOSIS — R293 Abnormal posture: Secondary | ICD-10-CM | POA: Diagnosis not present

## 2017-12-08 DIAGNOSIS — R5383 Other fatigue: Secondary | ICD-10-CM | POA: Diagnosis not present

## 2017-12-08 DIAGNOSIS — I10 Essential (primary) hypertension: Secondary | ICD-10-CM | POA: Diagnosis not present

## 2017-12-08 DIAGNOSIS — R262 Difficulty in walking, not elsewhere classified: Secondary | ICD-10-CM | POA: Diagnosis not present

## 2017-12-08 DIAGNOSIS — Z954 Presence of other heart-valve replacement: Secondary | ICD-10-CM | POA: Diagnosis not present

## 2017-12-08 DIAGNOSIS — M545 Low back pain: Secondary | ICD-10-CM | POA: Diagnosis not present

## 2017-12-10 DIAGNOSIS — R262 Difficulty in walking, not elsewhere classified: Secondary | ICD-10-CM | POA: Diagnosis not present

## 2017-12-10 DIAGNOSIS — M6281 Muscle weakness (generalized): Secondary | ICD-10-CM | POA: Diagnosis not present

## 2017-12-10 DIAGNOSIS — R5383 Other fatigue: Secondary | ICD-10-CM | POA: Diagnosis not present

## 2017-12-10 DIAGNOSIS — R293 Abnormal posture: Secondary | ICD-10-CM | POA: Diagnosis not present

## 2017-12-17 DIAGNOSIS — R293 Abnormal posture: Secondary | ICD-10-CM | POA: Diagnosis not present

## 2017-12-17 DIAGNOSIS — R5383 Other fatigue: Secondary | ICD-10-CM | POA: Diagnosis not present

## 2017-12-17 DIAGNOSIS — M6281 Muscle weakness (generalized): Secondary | ICD-10-CM | POA: Diagnosis not present

## 2017-12-17 DIAGNOSIS — R262 Difficulty in walking, not elsewhere classified: Secondary | ICD-10-CM | POA: Diagnosis not present

## 2017-12-20 DIAGNOSIS — R262 Difficulty in walking, not elsewhere classified: Secondary | ICD-10-CM | POA: Diagnosis not present

## 2017-12-20 DIAGNOSIS — R5383 Other fatigue: Secondary | ICD-10-CM | POA: Diagnosis not present

## 2017-12-20 DIAGNOSIS — Z954 Presence of other heart-valve replacement: Secondary | ICD-10-CM | POA: Diagnosis not present

## 2017-12-20 DIAGNOSIS — M545 Low back pain: Secondary | ICD-10-CM | POA: Diagnosis not present

## 2017-12-20 DIAGNOSIS — M6281 Muscle weakness (generalized): Secondary | ICD-10-CM | POA: Diagnosis not present

## 2017-12-20 DIAGNOSIS — I10 Essential (primary) hypertension: Secondary | ICD-10-CM | POA: Diagnosis not present

## 2017-12-20 DIAGNOSIS — R293 Abnormal posture: Secondary | ICD-10-CM | POA: Diagnosis not present

## 2017-12-22 DIAGNOSIS — Z954 Presence of other heart-valve replacement: Secondary | ICD-10-CM | POA: Diagnosis not present

## 2017-12-22 DIAGNOSIS — M6281 Muscle weakness (generalized): Secondary | ICD-10-CM | POA: Diagnosis not present

## 2017-12-22 DIAGNOSIS — R293 Abnormal posture: Secondary | ICD-10-CM | POA: Diagnosis not present

## 2017-12-22 DIAGNOSIS — I10 Essential (primary) hypertension: Secondary | ICD-10-CM | POA: Diagnosis not present

## 2017-12-22 DIAGNOSIS — R262 Difficulty in walking, not elsewhere classified: Secondary | ICD-10-CM | POA: Diagnosis not present

## 2017-12-22 DIAGNOSIS — R5383 Other fatigue: Secondary | ICD-10-CM | POA: Diagnosis not present

## 2017-12-22 DIAGNOSIS — M545 Low back pain: Secondary | ICD-10-CM | POA: Diagnosis not present

## 2017-12-27 DIAGNOSIS — R05 Cough: Secondary | ICD-10-CM | POA: Diagnosis not present

## 2017-12-29 DIAGNOSIS — I10 Essential (primary) hypertension: Secondary | ICD-10-CM | POA: Diagnosis not present

## 2017-12-29 DIAGNOSIS — R262 Difficulty in walking, not elsewhere classified: Secondary | ICD-10-CM | POA: Diagnosis not present

## 2017-12-29 DIAGNOSIS — M6281 Muscle weakness (generalized): Secondary | ICD-10-CM | POA: Diagnosis not present

## 2017-12-29 DIAGNOSIS — Z954 Presence of other heart-valve replacement: Secondary | ICD-10-CM | POA: Diagnosis not present

## 2017-12-29 DIAGNOSIS — R293 Abnormal posture: Secondary | ICD-10-CM | POA: Diagnosis not present

## 2017-12-29 DIAGNOSIS — M545 Low back pain: Secondary | ICD-10-CM | POA: Diagnosis not present

## 2017-12-29 DIAGNOSIS — R5383 Other fatigue: Secondary | ICD-10-CM | POA: Diagnosis not present

## 2018-01-04 DIAGNOSIS — J449 Chronic obstructive pulmonary disease, unspecified: Secondary | ICD-10-CM | POA: Diagnosis not present

## 2018-01-24 DIAGNOSIS — S39012A Strain of muscle, fascia and tendon of lower back, initial encounter: Secondary | ICD-10-CM | POA: Diagnosis not present

## 2018-01-24 DIAGNOSIS — Z0000 Encounter for general adult medical examination without abnormal findings: Secondary | ICD-10-CM | POA: Diagnosis not present

## 2018-01-24 DIAGNOSIS — L989 Disorder of the skin and subcutaneous tissue, unspecified: Secondary | ICD-10-CM | POA: Diagnosis not present

## 2018-01-24 DIAGNOSIS — Z7901 Long term (current) use of anticoagulants: Secondary | ICD-10-CM | POA: Diagnosis not present

## 2018-01-24 DIAGNOSIS — I82592 Chronic embolism and thrombosis of other specified deep vein of left lower extremity: Secondary | ICD-10-CM | POA: Diagnosis not present

## 2018-01-31 DIAGNOSIS — K1120 Sialoadenitis, unspecified: Secondary | ICD-10-CM | POA: Diagnosis not present

## 2018-02-03 DIAGNOSIS — J449 Chronic obstructive pulmonary disease, unspecified: Secondary | ICD-10-CM | POA: Diagnosis not present

## 2018-02-03 DIAGNOSIS — M545 Low back pain: Secondary | ICD-10-CM | POA: Diagnosis not present

## 2018-02-17 DIAGNOSIS — M542 Cervicalgia: Secondary | ICD-10-CM | POA: Diagnosis not present

## 2018-02-21 DIAGNOSIS — R0602 Shortness of breath: Secondary | ICD-10-CM | POA: Diagnosis not present

## 2018-02-21 DIAGNOSIS — R069 Unspecified abnormalities of breathing: Secondary | ICD-10-CM | POA: Diagnosis not present

## 2018-02-21 DIAGNOSIS — R402441 Other coma, without documented Glasgow coma scale score, or with partial score reported, in the field [EMT or ambulance]: Secondary | ICD-10-CM | POA: Diagnosis not present

## 2018-02-21 DIAGNOSIS — R0902 Hypoxemia: Secondary | ICD-10-CM | POA: Diagnosis not present

## 2018-02-21 DIAGNOSIS — R609 Edema, unspecified: Secondary | ICD-10-CM | POA: Diagnosis not present

## 2018-02-22 DIAGNOSIS — A419 Sepsis, unspecified organism: Secondary | ICD-10-CM | POA: Diagnosis not present

## 2018-02-22 DIAGNOSIS — Z86711 Personal history of pulmonary embolism: Secondary | ICD-10-CM | POA: Diagnosis not present

## 2018-02-22 DIAGNOSIS — Z66 Do not resuscitate: Secondary | ICD-10-CM | POA: Diagnosis not present

## 2018-02-22 DIAGNOSIS — I509 Heart failure, unspecified: Secondary | ICD-10-CM | POA: Diagnosis not present

## 2018-02-22 DIAGNOSIS — Z9181 History of falling: Secondary | ICD-10-CM | POA: Diagnosis not present

## 2018-02-22 DIAGNOSIS — F0390 Unspecified dementia without behavioral disturbance: Secondary | ICD-10-CM | POA: Diagnosis not present

## 2018-02-22 DIAGNOSIS — Z950 Presence of cardiac pacemaker: Secondary | ICD-10-CM | POA: Diagnosis not present

## 2018-02-22 DIAGNOSIS — J9601 Acute respiratory failure with hypoxia: Secondary | ICD-10-CM | POA: Diagnosis not present

## 2018-02-22 DIAGNOSIS — Z79899 Other long term (current) drug therapy: Secondary | ICD-10-CM | POA: Diagnosis not present

## 2018-02-22 DIAGNOSIS — I110 Hypertensive heart disease with heart failure: Secondary | ICD-10-CM | POA: Diagnosis not present

## 2018-02-22 DIAGNOSIS — R55 Syncope and collapse: Secondary | ICD-10-CM | POA: Diagnosis not present

## 2018-02-22 DIAGNOSIS — J159 Unspecified bacterial pneumonia: Secondary | ICD-10-CM | POA: Diagnosis not present

## 2018-02-22 DIAGNOSIS — J9600 Acute respiratory failure, unspecified whether with hypoxia or hypercapnia: Secondary | ICD-10-CM | POA: Diagnosis not present

## 2018-02-22 DIAGNOSIS — J189 Pneumonia, unspecified organism: Secondary | ICD-10-CM | POA: Diagnosis not present

## 2018-02-22 DIAGNOSIS — I2510 Atherosclerotic heart disease of native coronary artery without angina pectoris: Secondary | ICD-10-CM | POA: Diagnosis not present

## 2018-02-22 DIAGNOSIS — Z7901 Long term (current) use of anticoagulants: Secondary | ICD-10-CM | POA: Diagnosis not present

## 2018-02-22 DIAGNOSIS — R410 Disorientation, unspecified: Secondary | ICD-10-CM | POA: Diagnosis not present

## 2018-02-22 DIAGNOSIS — J440 Chronic obstructive pulmonary disease with acute lower respiratory infection: Secondary | ICD-10-CM | POA: Diagnosis not present

## 2018-02-22 DIAGNOSIS — J9602 Acute respiratory failure with hypercapnia: Secondary | ICD-10-CM | POA: Diagnosis not present

## 2018-02-23 DIAGNOSIS — J440 Chronic obstructive pulmonary disease with acute lower respiratory infection: Secondary | ICD-10-CM | POA: Diagnosis not present

## 2018-02-23 DIAGNOSIS — J9601 Acute respiratory failure with hypoxia: Secondary | ICD-10-CM | POA: Diagnosis not present

## 2018-02-23 DIAGNOSIS — J9602 Acute respiratory failure with hypercapnia: Secondary | ICD-10-CM | POA: Diagnosis not present

## 2018-02-23 DIAGNOSIS — J189 Pneumonia, unspecified organism: Secondary | ICD-10-CM | POA: Diagnosis not present

## 2018-02-28 ENCOUNTER — Other Ambulatory Visit: Payer: Self-pay

## 2018-02-28 NOTE — Patient Outreach (Signed)
[facility]) Care Management  02/28/2018  Victoria Knox [birth year] [id number]   Transition of care  Referral date: 02/28/18 Referral source: discharged from [facility] [place] [facility] 6/719 Insurance: health team advantage.   Transition of care will be completed by primary care provider office who will refer to [facility] care management if needed.  Patients primary MD updated in patients chart to: Dr. Janace Litten  Telephone call to patient. HIPAA verified with patient. Explained reason for call. Patient states she was in the hospital due to pneumonia.  Confirms she was discharged on Friday 02/25/18.  Patient states she received a call from her doctors office today and has an appointment scheduled for 03/03/18.  Patient reports she is also having home physical therapy.  RNCM discussed and offered [facility] care management services. Patient declined services at this time.   RNCM advised patient to notify MD of any changes in condition prior to scheduled appointment. RNCM provided contact name and number: [phone] or main office number [phone] and 24 hour nurse advise line [phone] by mail as requested by patient. Marland Kitchen  RNCM verified patient aware of 911 services for urgent/ emergent needs.   PLAN:  RNCM will close patient due to refusal of services.  RNCM will send patient [facility] care management brochure/ magnet RNCM will send closure notification to patients primary MD.   Quinn Plowman RN,BSN,CCM [facility] Telephonic  [phone]

## 2018-03-03 DIAGNOSIS — B029 Zoster without complications: Secondary | ICD-10-CM | POA: Diagnosis not present

## 2018-03-03 DIAGNOSIS — J189 Pneumonia, unspecified organism: Secondary | ICD-10-CM | POA: Diagnosis not present

## 2018-03-03 DIAGNOSIS — E785 Hyperlipidemia, unspecified: Secondary | ICD-10-CM | POA: Diagnosis not present

## 2018-03-03 DIAGNOSIS — I10 Essential (primary) hypertension: Secondary | ICD-10-CM | POA: Diagnosis not present

## 2018-03-03 DIAGNOSIS — E559 Vitamin D deficiency, unspecified: Secondary | ICD-10-CM | POA: Diagnosis not present

## 2018-03-06 DIAGNOSIS — J449 Chronic obstructive pulmonary disease, unspecified: Secondary | ICD-10-CM | POA: Diagnosis not present

## 2018-03-28 DIAGNOSIS — G8929 Other chronic pain: Secondary | ICD-10-CM | POA: Diagnosis not present

## 2018-03-28 DIAGNOSIS — J440 Chronic obstructive pulmonary disease with acute lower respiratory infection: Secondary | ICD-10-CM | POA: Diagnosis not present

## 2018-03-28 DIAGNOSIS — M545 Low back pain: Secondary | ICD-10-CM | POA: Diagnosis not present

## 2018-04-01 DIAGNOSIS — M47819 Spondylosis without myelopathy or radiculopathy, site unspecified: Secondary | ICD-10-CM | POA: Diagnosis not present

## 2018-04-01 DIAGNOSIS — N3001 Acute cystitis with hematuria: Secondary | ICD-10-CM | POA: Diagnosis not present

## 2018-04-05 DIAGNOSIS — J449 Chronic obstructive pulmonary disease, unspecified: Secondary | ICD-10-CM | POA: Diagnosis not present

## 2018-04-08 DIAGNOSIS — M5136 Other intervertebral disc degeneration, lumbar region: Secondary | ICD-10-CM | POA: Diagnosis not present

## 2018-04-08 DIAGNOSIS — M47816 Spondylosis without myelopathy or radiculopathy, lumbar region: Secondary | ICD-10-CM | POA: Diagnosis not present

## 2018-04-11 DIAGNOSIS — Z45010 Encounter for checking and testing of cardiac pacemaker pulse generator [battery]: Secondary | ICD-10-CM | POA: Diagnosis not present

## 2018-04-13 DIAGNOSIS — E0590 Thyrotoxicosis, unspecified without thyrotoxic crisis or storm: Secondary | ICD-10-CM | POA: Diagnosis not present

## 2018-04-14 DIAGNOSIS — D3502 Benign neoplasm of left adrenal gland: Secondary | ICD-10-CM | POA: Diagnosis not present

## 2018-04-14 DIAGNOSIS — E0590 Thyrotoxicosis, unspecified without thyrotoxic crisis or storm: Secondary | ICD-10-CM | POA: Diagnosis not present

## 2018-04-14 DIAGNOSIS — E042 Nontoxic multinodular goiter: Secondary | ICD-10-CM | POA: Diagnosis not present

## 2018-05-06 DIAGNOSIS — J449 Chronic obstructive pulmonary disease, unspecified: Secondary | ICD-10-CM | POA: Diagnosis not present

## 2018-05-09 ENCOUNTER — Other Ambulatory Visit: Payer: Self-pay | Admitting: *Deleted

## 2018-05-09 NOTE — Patient Outreach (Signed)
[facility] [facility] Care Management  05/09/2018  Victoria Knox 1935-03-27 [id number]  [facility] CM HTA/ HRA Screening Call   PCP: Dr. Janace Litten   Successful telephone outreach to patient for HTA- HRA screening; purpose of call was discussed with patient.  HIPAA/ identity verified with patient today and patient sounds to be in no apparent distress throughout phone call today.   Patient reports today: -- recently attended PCP appointment "last month;" reports will attend next appointment "sometime soon;" stating she can not remember "exactly when." -- reports recent hospital visit "in June, for heart surgery" states "doing much better now." -- denies need for transportation services- drives self to provider appointments; reports "stays active as possible" around her home.  Reports that her sister lives close by and "comes to visit and check in every day." -- independently manages self-care for ADL's and IADL's -- denies recent falls; reports no current use of/ need for assistive devices in home; although she does have cane/ walker present in home for use as needed; reports that she attend outpatient PT sessions post-hospitalization earlier this summer -- Supportive family member (sister) that is able to assist with care needs as indicated; patient reports she is doing "so well," that this has not been necessary lately -- verbalizes a good understanding of current medications; takes "about 12" oral medications and self-manages without assistance from others; uses pill box that she fills independently for a week at a time.  Denies concerns with affording medications/ side effects of medications -- currently has HCPOA and living will; states "everything is in place;" denies desire to make changes in current Advanced Directives -- uses home O2 "all the time" at 2-3 L/min; states has "been on oxygen for a long time;" states she "is used to it." -- discussed patient's overall medical  conditions, which she reports as: "heart problems and breathing problems;" states that she "manages just fine on" her own; denies need for additional assistance around her daily care; states she "has been dealing with" her health problems "for many years," and believes that she is "doing just fine." -- explained that I would place [facility] CM welcome letter and packet with 24-hour nurse advice line magnet in mail to her, as she does not remember receiving same post-recent hospital discharge when [facility] telephonic RN CM contacted her; patient agreeable to this   Reiterated [facility] CM services available to patient through his insurance provider and encouraged patient to contact [facility] CM in the future if needs arise, and patient verbalizes agreement.   Plan:   Will place [facility] CM welcome letter/ packet in mail to patient   Oneta Rack, RN, BSN, [place] Coordinator [facility] Care Management  [phone]

## 2018-06-03 DIAGNOSIS — I2782 Chronic pulmonary embolism: Secondary | ICD-10-CM | POA: Diagnosis not present

## 2018-06-03 DIAGNOSIS — E7849 Other hyperlipidemia: Secondary | ICD-10-CM | POA: Diagnosis not present

## 2018-06-03 DIAGNOSIS — Z952 Presence of prosthetic heart valve: Secondary | ICD-10-CM | POA: Diagnosis not present

## 2018-06-03 DIAGNOSIS — I739 Peripheral vascular disease, unspecified: Secondary | ICD-10-CM | POA: Diagnosis not present

## 2018-06-03 DIAGNOSIS — J449 Chronic obstructive pulmonary disease, unspecified: Secondary | ICD-10-CM | POA: Diagnosis not present

## 2018-06-03 DIAGNOSIS — Z7901 Long term (current) use of anticoagulants: Secondary | ICD-10-CM | POA: Diagnosis not present

## 2018-06-03 DIAGNOSIS — Z45018 Encounter for adjustment and management of other part of cardiac pacemaker: Secondary | ICD-10-CM | POA: Diagnosis not present

## 2018-06-06 DIAGNOSIS — J181 Lobar pneumonia, unspecified organism: Secondary | ICD-10-CM | POA: Diagnosis not present

## 2018-06-06 DIAGNOSIS — R05 Cough: Secondary | ICD-10-CM | POA: Diagnosis not present

## 2018-06-06 DIAGNOSIS — J449 Chronic obstructive pulmonary disease, unspecified: Secondary | ICD-10-CM | POA: Diagnosis not present

## 2018-06-08 DIAGNOSIS — Z952 Presence of prosthetic heart valve: Secondary | ICD-10-CM | POA: Diagnosis not present

## 2018-06-14 DIAGNOSIS — M545 Low back pain: Secondary | ICD-10-CM | POA: Diagnosis not present

## 2018-06-17 DIAGNOSIS — J181 Lobar pneumonia, unspecified organism: Secondary | ICD-10-CM | POA: Diagnosis not present

## 2018-06-17 DIAGNOSIS — Z23 Encounter for immunization: Secondary | ICD-10-CM | POA: Diagnosis not present

## 2018-07-01 DIAGNOSIS — H2703 Aphakia, bilateral: Secondary | ICD-10-CM | POA: Diagnosis not present

## 2018-07-06 DIAGNOSIS — J449 Chronic obstructive pulmonary disease, unspecified: Secondary | ICD-10-CM | POA: Diagnosis not present

## 2018-07-11 DIAGNOSIS — Z950 Presence of cardiac pacemaker: Secondary | ICD-10-CM | POA: Diagnosis not present

## 2018-07-21 DIAGNOSIS — M47816 Spondylosis without myelopathy or radiculopathy, lumbar region: Secondary | ICD-10-CM | POA: Diagnosis not present

## 2018-07-21 DIAGNOSIS — M47817 Spondylosis without myelopathy or radiculopathy, lumbosacral region: Secondary | ICD-10-CM | POA: Diagnosis not present

## 2018-07-27 DIAGNOSIS — M47816 Spondylosis without myelopathy or radiculopathy, lumbar region: Secondary | ICD-10-CM | POA: Diagnosis not present

## 2018-07-27 DIAGNOSIS — M47819 Spondylosis without myelopathy or radiculopathy, site unspecified: Secondary | ICD-10-CM | POA: Diagnosis not present

## 2018-07-27 DIAGNOSIS — J40 Bronchitis, not specified as acute or chronic: Secondary | ICD-10-CM | POA: Diagnosis not present

## 2018-07-27 DIAGNOSIS — M545 Low back pain: Secondary | ICD-10-CM | POA: Diagnosis not present

## 2018-07-29 DIAGNOSIS — R05 Cough: Secondary | ICD-10-CM | POA: Diagnosis not present

## 2018-07-29 DIAGNOSIS — J40 Bronchitis, not specified as acute or chronic: Secondary | ICD-10-CM | POA: Diagnosis not present

## 2018-07-29 DIAGNOSIS — Z87891 Personal history of nicotine dependence: Secondary | ICD-10-CM | POA: Diagnosis not present

## 2018-08-01 DIAGNOSIS — J40 Bronchitis, not specified as acute or chronic: Secondary | ICD-10-CM | POA: Diagnosis not present

## 2018-08-01 DIAGNOSIS — J329 Chronic sinusitis, unspecified: Secondary | ICD-10-CM | POA: Diagnosis not present

## 2018-08-06 DIAGNOSIS — J449 Chronic obstructive pulmonary disease, unspecified: Secondary | ICD-10-CM | POA: Diagnosis not present

## 2018-08-15 DIAGNOSIS — S82831A Other fracture of upper and lower end of right fibula, initial encounter for closed fracture: Secondary | ICD-10-CM | POA: Diagnosis not present

## 2018-08-15 DIAGNOSIS — M25561 Pain in right knee: Secondary | ICD-10-CM | POA: Diagnosis not present

## 2018-08-15 DIAGNOSIS — S82821A Torus fracture of lower end of right fibula, initial encounter for closed fracture: Secondary | ICD-10-CM | POA: Diagnosis not present

## 2018-08-15 DIAGNOSIS — M25571 Pain in right ankle and joints of right foot: Secondary | ICD-10-CM | POA: Diagnosis not present

## 2018-08-15 DIAGNOSIS — Z23 Encounter for immunization: Secondary | ICD-10-CM | POA: Diagnosis not present

## 2018-08-15 DIAGNOSIS — S50311A Abrasion of right elbow, initial encounter: Secondary | ICD-10-CM | POA: Diagnosis not present

## 2018-08-15 DIAGNOSIS — S8991XA Unspecified injury of right lower leg, initial encounter: Secondary | ICD-10-CM | POA: Diagnosis not present

## 2018-08-16 DIAGNOSIS — S82831A Other fracture of upper and lower end of right fibula, initial encounter for closed fracture: Secondary | ICD-10-CM | POA: Diagnosis not present

## 2018-08-19 DIAGNOSIS — E042 Nontoxic multinodular goiter: Secondary | ICD-10-CM | POA: Diagnosis not present

## 2018-08-19 DIAGNOSIS — D3502 Benign neoplasm of left adrenal gland: Secondary | ICD-10-CM | POA: Diagnosis not present

## 2018-08-19 DIAGNOSIS — E0590 Thyrotoxicosis, unspecified without thyrotoxic crisis or storm: Secondary | ICD-10-CM | POA: Diagnosis not present

## 2018-08-25 DIAGNOSIS — L03317 Cellulitis of buttock: Secondary | ICD-10-CM | POA: Diagnosis not present

## 2018-08-25 DIAGNOSIS — R3989 Other symptoms and signs involving the genitourinary system: Secondary | ICD-10-CM | POA: Diagnosis not present

## 2018-08-26 DIAGNOSIS — R3989 Other symptoms and signs involving the genitourinary system: Secondary | ICD-10-CM | POA: Diagnosis not present

## 2018-08-29 DIAGNOSIS — R4182 Altered mental status, unspecified: Secondary | ICD-10-CM | POA: Diagnosis not present

## 2018-08-29 DIAGNOSIS — L03317 Cellulitis of buttock: Secondary | ICD-10-CM | POA: Diagnosis not present

## 2018-08-29 DIAGNOSIS — N3000 Acute cystitis without hematuria: Secondary | ICD-10-CM | POA: Diagnosis not present

## 2018-08-29 DIAGNOSIS — B9689 Other specified bacterial agents as the cause of diseases classified elsewhere: Secondary | ICD-10-CM | POA: Diagnosis not present

## 2018-08-29 DIAGNOSIS — J9602 Acute respiratory failure with hypercapnia: Secondary | ICD-10-CM | POA: Diagnosis not present

## 2018-08-30 DIAGNOSIS — Z7901 Long term (current) use of anticoagulants: Secondary | ICD-10-CM | POA: Diagnosis not present

## 2018-08-30 DIAGNOSIS — I10 Essential (primary) hypertension: Secondary | ICD-10-CM | POA: Diagnosis not present

## 2018-08-30 DIAGNOSIS — L03317 Cellulitis of buttock: Secondary | ICD-10-CM | POA: Diagnosis not present

## 2018-08-30 DIAGNOSIS — I350 Nonrheumatic aortic (valve) stenosis: Secondary | ICD-10-CM | POA: Diagnosis not present

## 2018-08-30 DIAGNOSIS — N3000 Acute cystitis without hematuria: Secondary | ICD-10-CM | POA: Diagnosis not present

## 2018-08-30 DIAGNOSIS — J9612 Chronic respiratory failure with hypercapnia: Secondary | ICD-10-CM | POA: Diagnosis not present

## 2018-08-30 DIAGNOSIS — Z86718 Personal history of other venous thrombosis and embolism: Secondary | ICD-10-CM | POA: Diagnosis not present

## 2018-08-30 DIAGNOSIS — Z79899 Other long term (current) drug therapy: Secondary | ICD-10-CM | POA: Diagnosis not present

## 2018-08-30 DIAGNOSIS — I2510 Atherosclerotic heart disease of native coronary artery without angina pectoris: Secondary | ICD-10-CM | POA: Diagnosis not present

## 2018-08-30 DIAGNOSIS — Z7982 Long term (current) use of aspirin: Secondary | ICD-10-CM | POA: Diagnosis not present

## 2018-08-30 DIAGNOSIS — M109 Gout, unspecified: Secondary | ICD-10-CM | POA: Diagnosis not present

## 2018-08-30 DIAGNOSIS — I110 Hypertensive heart disease with heart failure: Secondary | ICD-10-CM | POA: Diagnosis not present

## 2018-08-30 DIAGNOSIS — J9602 Acute respiratory failure with hypercapnia: Secondary | ICD-10-CM | POA: Diagnosis not present

## 2018-08-30 DIAGNOSIS — R4182 Altered mental status, unspecified: Secondary | ICD-10-CM | POA: Diagnosis not present

## 2018-08-30 DIAGNOSIS — G9341 Metabolic encephalopathy: Secondary | ICD-10-CM | POA: Diagnosis not present

## 2018-08-30 DIAGNOSIS — J9692 Respiratory failure, unspecified with hypercapnia: Secondary | ICD-10-CM | POA: Diagnosis not present

## 2018-08-30 DIAGNOSIS — Z8614 Personal history of Methicillin resistant Staphylococcus aureus infection: Secondary | ICD-10-CM | POA: Diagnosis not present

## 2018-08-30 DIAGNOSIS — Z9981 Dependence on supplemental oxygen: Secondary | ICD-10-CM | POA: Diagnosis not present

## 2018-08-30 DIAGNOSIS — J449 Chronic obstructive pulmonary disease, unspecified: Secondary | ICD-10-CM | POA: Diagnosis not present

## 2018-08-30 DIAGNOSIS — B9689 Other specified bacterial agents as the cause of diseases classified elsewhere: Secondary | ICD-10-CM | POA: Diagnosis not present

## 2018-08-30 DIAGNOSIS — J9622 Acute and chronic respiratory failure with hypercapnia: Secondary | ICD-10-CM | POA: Diagnosis not present

## 2018-08-30 DIAGNOSIS — N390 Urinary tract infection, site not specified: Secondary | ICD-10-CM | POA: Diagnosis not present

## 2018-08-30 DIAGNOSIS — Z950 Presence of cardiac pacemaker: Secondary | ICD-10-CM | POA: Diagnosis not present

## 2018-08-30 DIAGNOSIS — J9611 Chronic respiratory failure with hypoxia: Secondary | ICD-10-CM | POA: Diagnosis not present

## 2018-08-30 DIAGNOSIS — Z952 Presence of prosthetic heart valve: Secondary | ICD-10-CM | POA: Diagnosis not present

## 2018-08-30 DIAGNOSIS — Z86711 Personal history of pulmonary embolism: Secondary | ICD-10-CM | POA: Diagnosis not present

## 2018-08-30 DIAGNOSIS — E0590 Thyrotoxicosis, unspecified without thyrotoxic crisis or storm: Secondary | ICD-10-CM | POA: Diagnosis not present

## 2018-08-30 DIAGNOSIS — I509 Heart failure, unspecified: Secondary | ICD-10-CM | POA: Diagnosis not present

## 2018-08-30 DIAGNOSIS — Z87891 Personal history of nicotine dependence: Secondary | ICD-10-CM | POA: Diagnosis not present

## 2018-08-30 DIAGNOSIS — Z951 Presence of aortocoronary bypass graft: Secondary | ICD-10-CM | POA: Diagnosis not present

## 2018-08-30 DIAGNOSIS — E860 Dehydration: Secondary | ICD-10-CM | POA: Diagnosis not present

## 2018-08-31 DIAGNOSIS — J9611 Chronic respiratory failure with hypoxia: Secondary | ICD-10-CM

## 2018-08-31 DIAGNOSIS — J449 Chronic obstructive pulmonary disease, unspecified: Secondary | ICD-10-CM

## 2018-08-31 DIAGNOSIS — G9341 Metabolic encephalopathy: Secondary | ICD-10-CM

## 2018-09-05 DIAGNOSIS — R0902 Hypoxemia: Secondary | ICD-10-CM | POA: Diagnosis not present

## 2018-09-05 DIAGNOSIS — E876 Hypokalemia: Secondary | ICD-10-CM | POA: Diagnosis not present

## 2018-09-05 DIAGNOSIS — Z7901 Long term (current) use of anticoagulants: Secondary | ICD-10-CM | POA: Diagnosis not present

## 2018-09-05 DIAGNOSIS — G253 Myoclonus: Secondary | ICD-10-CM | POA: Diagnosis not present

## 2018-09-05 DIAGNOSIS — D649 Anemia, unspecified: Secondary | ICD-10-CM | POA: Diagnosis not present

## 2018-09-05 DIAGNOSIS — E662 Morbid (severe) obesity with alveolar hypoventilation: Secondary | ICD-10-CM | POA: Diagnosis not present

## 2018-09-05 DIAGNOSIS — I5032 Chronic diastolic (congestive) heart failure: Secondary | ICD-10-CM | POA: Diagnosis not present

## 2018-09-05 DIAGNOSIS — J9602 Acute respiratory failure with hypercapnia: Secondary | ICD-10-CM | POA: Diagnosis not present

## 2018-09-05 DIAGNOSIS — E0590 Thyrotoxicosis, unspecified without thyrotoxic crisis or storm: Secondary | ICD-10-CM | POA: Diagnosis not present

## 2018-09-05 DIAGNOSIS — J441 Chronic obstructive pulmonary disease with (acute) exacerbation: Secondary | ICD-10-CM | POA: Diagnosis not present

## 2018-09-05 DIAGNOSIS — R140 Abdominal distension (gaseous): Secondary | ICD-10-CM | POA: Diagnosis not present

## 2018-09-05 DIAGNOSIS — I252 Old myocardial infarction: Secondary | ICD-10-CM | POA: Diagnosis not present

## 2018-09-05 DIAGNOSIS — R918 Other nonspecific abnormal finding of lung field: Secondary | ICD-10-CM | POA: Diagnosis not present

## 2018-09-05 DIAGNOSIS — Z7952 Long term (current) use of systemic steroids: Secondary | ICD-10-CM | POA: Diagnosis not present

## 2018-09-05 DIAGNOSIS — Z87891 Personal history of nicotine dependence: Secondary | ICD-10-CM | POA: Diagnosis not present

## 2018-09-05 DIAGNOSIS — Z9981 Dependence on supplemental oxygen: Secondary | ICD-10-CM | POA: Diagnosis not present

## 2018-09-05 DIAGNOSIS — I110 Hypertensive heart disease with heart failure: Secondary | ICD-10-CM | POA: Diagnosis not present

## 2018-09-05 DIAGNOSIS — Z79899 Other long term (current) drug therapy: Secondary | ICD-10-CM | POA: Diagnosis not present

## 2018-09-05 DIAGNOSIS — I313 Pericardial effusion (noninflammatory): Secondary | ICD-10-CM | POA: Diagnosis not present

## 2018-09-05 DIAGNOSIS — A419 Sepsis, unspecified organism: Secondary | ICD-10-CM | POA: Diagnosis not present

## 2018-09-05 DIAGNOSIS — G9341 Metabolic encephalopathy: Secondary | ICD-10-CM | POA: Diagnosis not present

## 2018-09-05 DIAGNOSIS — R531 Weakness: Secondary | ICD-10-CM | POA: Diagnosis not present

## 2018-09-05 DIAGNOSIS — R410 Disorientation, unspecified: Secondary | ICD-10-CM | POA: Diagnosis not present

## 2018-09-05 DIAGNOSIS — Z950 Presence of cardiac pacemaker: Secondary | ICD-10-CM | POA: Diagnosis not present

## 2018-09-05 DIAGNOSIS — I10 Essential (primary) hypertension: Secondary | ICD-10-CM | POA: Diagnosis not present

## 2018-09-05 DIAGNOSIS — I509 Heart failure, unspecified: Secondary | ICD-10-CM | POA: Diagnosis not present

## 2018-09-05 DIAGNOSIS — I447 Left bundle-branch block, unspecified: Secondary | ICD-10-CM | POA: Diagnosis not present

## 2018-09-05 DIAGNOSIS — J449 Chronic obstructive pulmonary disease, unspecified: Secondary | ICD-10-CM | POA: Diagnosis not present

## 2018-09-05 DIAGNOSIS — J9622 Acute and chronic respiratory failure with hypercapnia: Secondary | ICD-10-CM | POA: Diagnosis not present

## 2018-09-05 DIAGNOSIS — Z8679 Personal history of other diseases of the circulatory system: Secondary | ICD-10-CM | POA: Diagnosis not present

## 2018-09-05 DIAGNOSIS — I2510 Atherosclerotic heart disease of native coronary artery without angina pectoris: Secondary | ICD-10-CM | POA: Diagnosis not present

## 2018-09-07 ENCOUNTER — Other Ambulatory Visit: Payer: Self-pay

## 2018-09-07 NOTE — Patient Outreach (Signed)
[facility]) Care Management  09/07/2018  RAEL TILLY 1934-11-27 [id number]    Referral received. No outreach warranted at this time. Transition of Care  will be completed by primary care provider office who will refer to [facility] care management if needed.  Plan: RN CM will close case.  Jone Baseman, RN, MSN [place] Management Care Management Coordinator Direct Line [phone] Cell [phone] Toll Free: [phone]  Fax: [phone]

## 2018-09-08 DIAGNOSIS — J9622 Acute and chronic respiratory failure with hypercapnia: Secondary | ICD-10-CM | POA: Diagnosis not present

## 2018-09-08 DIAGNOSIS — Z7901 Long term (current) use of anticoagulants: Secondary | ICD-10-CM | POA: Diagnosis not present

## 2018-09-08 DIAGNOSIS — Z9981 Dependence on supplemental oxygen: Secondary | ICD-10-CM | POA: Diagnosis not present

## 2018-09-08 DIAGNOSIS — I5032 Chronic diastolic (congestive) heart failure: Secondary | ICD-10-CM | POA: Diagnosis not present

## 2018-09-08 DIAGNOSIS — E0590 Thyrotoxicosis, unspecified without thyrotoxic crisis or storm: Secondary | ICD-10-CM | POA: Diagnosis not present

## 2018-09-08 DIAGNOSIS — F329 Major depressive disorder, single episode, unspecified: Secondary | ICD-10-CM | POA: Diagnosis not present

## 2018-09-08 DIAGNOSIS — J441 Chronic obstructive pulmonary disease with (acute) exacerbation: Secondary | ICD-10-CM | POA: Diagnosis not present

## 2018-09-08 DIAGNOSIS — D649 Anemia, unspecified: Secondary | ICD-10-CM | POA: Diagnosis not present

## 2018-09-08 DIAGNOSIS — M1991 Primary osteoarthritis, unspecified site: Secondary | ICD-10-CM | POA: Diagnosis not present

## 2018-09-08 DIAGNOSIS — Z6832 Body mass index (BMI) 32.0-32.9, adult: Secondary | ICD-10-CM | POA: Diagnosis not present

## 2018-09-08 DIAGNOSIS — I350 Nonrheumatic aortic (valve) stenosis: Secondary | ICD-10-CM | POA: Diagnosis not present

## 2018-09-08 DIAGNOSIS — I110 Hypertensive heart disease with heart failure: Secondary | ICD-10-CM | POA: Diagnosis not present

## 2018-09-08 DIAGNOSIS — I2510 Atherosclerotic heart disease of native coronary artery without angina pectoris: Secondary | ICD-10-CM | POA: Diagnosis not present

## 2018-09-08 DIAGNOSIS — Z853 Personal history of malignant neoplasm of breast: Secondary | ICD-10-CM | POA: Diagnosis not present

## 2018-09-08 DIAGNOSIS — Z86711 Personal history of pulmonary embolism: Secondary | ICD-10-CM | POA: Diagnosis not present

## 2018-09-08 DIAGNOSIS — Z87440 Personal history of urinary (tract) infections: Secondary | ICD-10-CM | POA: Diagnosis not present

## 2018-09-08 DIAGNOSIS — I313 Pericardial effusion (noninflammatory): Secondary | ICD-10-CM | POA: Diagnosis not present

## 2018-09-08 DIAGNOSIS — L89312 Pressure ulcer of right buttock, stage 2: Secondary | ICD-10-CM | POA: Diagnosis not present

## 2018-09-08 DIAGNOSIS — M109 Gout, unspecified: Secondary | ICD-10-CM | POA: Diagnosis not present

## 2018-09-08 DIAGNOSIS — Z950 Presence of cardiac pacemaker: Secondary | ICD-10-CM | POA: Diagnosis not present

## 2018-09-08 DIAGNOSIS — Z7982 Long term (current) use of aspirin: Secondary | ICD-10-CM | POA: Diagnosis not present

## 2018-09-08 DIAGNOSIS — E662 Morbid (severe) obesity with alveolar hypoventilation: Secondary | ICD-10-CM | POA: Diagnosis not present

## 2018-09-08 DIAGNOSIS — Z951 Presence of aortocoronary bypass graft: Secondary | ICD-10-CM | POA: Diagnosis not present

## 2018-09-18 DIAGNOSIS — I959 Hypotension, unspecified: Secondary | ICD-10-CM | POA: Diagnosis not present

## 2018-09-18 DIAGNOSIS — J449 Chronic obstructive pulmonary disease, unspecified: Secondary | ICD-10-CM | POA: Diagnosis not present

## 2018-09-18 DIAGNOSIS — R52 Pain, unspecified: Secondary | ICD-10-CM | POA: Diagnosis not present

## 2018-09-18 DIAGNOSIS — R4182 Altered mental status, unspecified: Secondary | ICD-10-CM | POA: Diagnosis not present

## 2018-09-18 DIAGNOSIS — N179 Acute kidney failure, unspecified: Secondary | ICD-10-CM | POA: Diagnosis not present

## 2018-09-18 DIAGNOSIS — E875 Hyperkalemia: Secondary | ICD-10-CM | POA: Diagnosis not present

## 2018-09-18 DIAGNOSIS — R410 Disorientation, unspecified: Secondary | ICD-10-CM | POA: Diagnosis not present

## 2018-09-18 DIAGNOSIS — R531 Weakness: Secondary | ICD-10-CM | POA: Diagnosis not present

## 2018-09-18 DIAGNOSIS — K120 Recurrent oral aphthae: Secondary | ICD-10-CM | POA: Diagnosis not present

## 2018-09-19 DIAGNOSIS — I059 Rheumatic mitral valve disease, unspecified: Secondary | ICD-10-CM | POA: Diagnosis not present

## 2018-09-19 DIAGNOSIS — I10 Essential (primary) hypertension: Secondary | ICD-10-CM | POA: Diagnosis not present

## 2018-09-19 DIAGNOSIS — L299 Pruritus, unspecified: Secondary | ICD-10-CM | POA: Diagnosis not present

## 2018-09-19 DIAGNOSIS — I2510 Atherosclerotic heart disease of native coronary artery without angina pectoris: Secondary | ICD-10-CM | POA: Diagnosis not present

## 2018-09-19 DIAGNOSIS — R1909 Other intra-abdominal and pelvic swelling, mass and lump: Secondary | ICD-10-CM | POA: Diagnosis not present

## 2018-09-19 DIAGNOSIS — J9622 Acute and chronic respiratory failure with hypercapnia: Secondary | ICD-10-CM | POA: Diagnosis not present

## 2018-09-19 DIAGNOSIS — Z781 Physical restraint status: Secondary | ICD-10-CM | POA: Diagnosis not present

## 2018-09-19 DIAGNOSIS — I340 Nonrheumatic mitral (valve) insufficiency: Secondary | ICD-10-CM | POA: Diagnosis not present

## 2018-09-19 DIAGNOSIS — I252 Old myocardial infarction: Secondary | ICD-10-CM | POA: Diagnosis not present

## 2018-09-19 DIAGNOSIS — Z952 Presence of prosthetic heart valve: Secondary | ICD-10-CM | POA: Diagnosis not present

## 2018-09-19 DIAGNOSIS — R4182 Altered mental status, unspecified: Secondary | ICD-10-CM | POA: Diagnosis not present

## 2018-09-19 DIAGNOSIS — R9082 White matter disease, unspecified: Secondary | ICD-10-CM | POA: Diagnosis not present

## 2018-09-19 DIAGNOSIS — J449 Chronic obstructive pulmonary disease, unspecified: Secondary | ICD-10-CM | POA: Diagnosis not present

## 2018-09-19 DIAGNOSIS — N179 Acute kidney failure, unspecified: Secondary | ICD-10-CM | POA: Diagnosis not present

## 2018-09-19 DIAGNOSIS — Z888 Allergy status to other drugs, medicaments and biological substances status: Secondary | ICD-10-CM | POA: Diagnosis not present

## 2018-09-19 DIAGNOSIS — G253 Myoclonus: Secondary | ICD-10-CM | POA: Diagnosis not present

## 2018-09-19 DIAGNOSIS — Z885 Allergy status to narcotic agent status: Secondary | ICD-10-CM | POA: Diagnosis not present

## 2018-09-19 DIAGNOSIS — Z66 Do not resuscitate: Secondary | ICD-10-CM | POA: Diagnosis not present

## 2018-09-19 DIAGNOSIS — B379 Candidiasis, unspecified: Secondary | ICD-10-CM | POA: Diagnosis not present

## 2018-09-19 DIAGNOSIS — R0602 Shortness of breath: Secondary | ICD-10-CM | POA: Diagnosis not present

## 2018-09-19 DIAGNOSIS — Z452 Encounter for adjustment and management of vascular access device: Secondary | ICD-10-CM | POA: Diagnosis not present

## 2018-09-19 DIAGNOSIS — G9341 Metabolic encephalopathy: Secondary | ICD-10-CM | POA: Diagnosis not present

## 2018-09-19 DIAGNOSIS — Z87891 Personal history of nicotine dependence: Secondary | ICD-10-CM | POA: Diagnosis not present

## 2018-09-19 DIAGNOSIS — E0590 Thyrotoxicosis, unspecified without thyrotoxic crisis or storm: Secondary | ICD-10-CM | POA: Diagnosis not present

## 2018-09-19 DIAGNOSIS — J90 Pleural effusion, not elsewhere classified: Secondary | ICD-10-CM | POA: Diagnosis not present

## 2018-09-19 DIAGNOSIS — R278 Other lack of coordination: Secondary | ICD-10-CM | POA: Diagnosis not present

## 2018-09-19 DIAGNOSIS — J441 Chronic obstructive pulmonary disease with (acute) exacerbation: Secondary | ICD-10-CM | POA: Diagnosis not present

## 2018-09-19 DIAGNOSIS — R918 Other nonspecific abnormal finding of lung field: Secondary | ICD-10-CM | POA: Diagnosis not present

## 2018-09-19 DIAGNOSIS — J9811 Atelectasis: Secondary | ICD-10-CM | POA: Diagnosis not present

## 2018-09-19 DIAGNOSIS — J9621 Acute and chronic respiratory failure with hypoxia: Secondary | ICD-10-CM | POA: Diagnosis not present

## 2018-09-19 DIAGNOSIS — J189 Pneumonia, unspecified organism: Secondary | ICD-10-CM | POA: Diagnosis not present

## 2018-09-19 DIAGNOSIS — Z9981 Dependence on supplemental oxygen: Secondary | ICD-10-CM | POA: Diagnosis not present

## 2018-09-19 DIAGNOSIS — R0603 Acute respiratory distress: Secondary | ICD-10-CM | POA: Diagnosis not present

## 2018-09-19 DIAGNOSIS — N19 Unspecified kidney failure: Secondary | ICD-10-CM | POA: Diagnosis not present

## 2018-09-19 DIAGNOSIS — E875 Hyperkalemia: Secondary | ICD-10-CM | POA: Diagnosis not present

## 2018-09-19 DIAGNOSIS — Z515 Encounter for palliative care: Secondary | ICD-10-CM | POA: Diagnosis not present

## 2018-09-19 DIAGNOSIS — G9340 Encephalopathy, unspecified: Secondary | ICD-10-CM | POA: Diagnosis not present

## 2018-09-19 DIAGNOSIS — E039 Hypothyroidism, unspecified: Secondary | ICD-10-CM | POA: Diagnosis not present

## 2018-09-19 DIAGNOSIS — I517 Cardiomegaly: Secondary | ICD-10-CM | POA: Diagnosis not present

## 2018-09-19 DIAGNOSIS — E785 Hyperlipidemia, unspecified: Secondary | ICD-10-CM | POA: Diagnosis not present

## 2018-09-19 DIAGNOSIS — A601 Herpesviral infection of perianal skin and rectum: Secondary | ICD-10-CM | POA: Diagnosis not present

## 2018-09-19 DIAGNOSIS — Z87440 Personal history of urinary (tract) infections: Secondary | ICD-10-CM | POA: Diagnosis not present

## 2018-09-19 DIAGNOSIS — J9612 Chronic respiratory failure with hypercapnia: Secondary | ICD-10-CM | POA: Diagnosis not present

## 2018-10-06 DIAGNOSIS — J9620 Acute and chronic respiratory failure, unspecified whether with hypoxia or hypercapnia: Secondary | ICD-10-CM | POA: Diagnosis not present

## 2018-10-06 DIAGNOSIS — J449 Chronic obstructive pulmonary disease, unspecified: Secondary | ICD-10-CM | POA: Diagnosis not present

## 2018-10-22 DEATH — deceased
